# Patient Record
Sex: Male | Born: 1945 | Race: White | Hispanic: No | Marital: Married | Smoking: Current some day smoker
Health system: Southern US, Community
[De-identification: ages and names within clinical notes are randomized; demographics above are authoritative.]

## PROBLEM LIST (undated history)

## (undated) DIAGNOSIS — F1020 Alcohol dependence, uncomplicated: Secondary | ICD-10-CM

## (undated) DIAGNOSIS — I509 Heart failure, unspecified: Secondary | ICD-10-CM

## (undated) DIAGNOSIS — I10 Essential (primary) hypertension: Secondary | ICD-10-CM

## (undated) DIAGNOSIS — J449 Chronic obstructive pulmonary disease, unspecified: Secondary | ICD-10-CM

## (undated) DIAGNOSIS — F17200 Nicotine dependence, unspecified, uncomplicated: Secondary | ICD-10-CM

## (undated) HISTORY — DX: Alcohol dependence, uncomplicated: F10.20

## (undated) HISTORY — DX: Chronic obstructive pulmonary disease, unspecified: J44.9

## (undated) HISTORY — DX: Nicotine dependence, unspecified, uncomplicated: F17.200

## (undated) HISTORY — DX: Essential (primary) hypertension: I10

---

## 2010-05-15 ENCOUNTER — Emergency Department (HOSPITAL_COMMUNITY)
Admission: EM | Admit: 2010-05-15 | Discharge: 2010-05-16 | Payer: Self-pay | Source: Home / Self Care | Admitting: Emergency Medicine

## 2010-08-01 LAB — DIFFERENTIAL
Lymphocytes Relative: 12 % (ref 12–46)
Lymphs Abs: 1.1 10*3/uL (ref 0.7–4.0)
Neutro Abs: 6.5 10*3/uL (ref 1.7–7.7)
Neutrophils Relative %: 75 % (ref 43–77)

## 2010-08-01 LAB — COMPREHENSIVE METABOLIC PANEL
Albumin: 3.7 g/dL (ref 3.5–5.2)
BUN: 17 mg/dL (ref 6–23)
Chloride: 97 mEq/L (ref 96–112)
Glucose, Bld: 98 mg/dL (ref 70–99)
Sodium: 130 mEq/L — ABNORMAL LOW (ref 135–145)
Total Bilirubin: 0.4 mg/dL (ref 0.3–1.2)

## 2010-08-01 LAB — CBC
Hemoglobin: 13.4 g/dL (ref 13.0–17.0)
MCH: 33.8 pg (ref 26.0–34.0)
RBC: 3.97 MIL/uL — ABNORMAL LOW (ref 4.22–5.81)

## 2010-08-01 LAB — POCT CARDIAC MARKERS
CKMB, poc: 1.8 ng/mL (ref 1.0–8.0)
Myoglobin, poc: 60.9 ng/mL (ref 12–200)
Troponin i, poc: <0.05 ng/mL (ref 0.00–0.09)

## 2010-08-01 LAB — ETHANOL: Alcohol, Ethyl (B): 142 mg/dL — ABNORMAL HIGH (ref 0–10)

## 2010-08-01 LAB — URINALYSIS, ROUTINE W REFLEX MICROSCOPIC
Ketones, ur: 15 mg/dL — AB
Nitrite: NEGATIVE
Protein, ur: NEGATIVE mg/dL
Urobilinogen, UA: 0.2 mg/dL (ref 0.0–1.0)

## 2011-05-01 ENCOUNTER — Ambulatory Visit (INDEPENDENT_AMBULATORY_CARE_PROVIDER_SITE_OTHER): Payer: PRIVATE HEALTH INSURANCE | Admitting: Family Medicine

## 2011-05-01 DIAGNOSIS — J449 Chronic obstructive pulmonary disease, unspecified: Secondary | ICD-10-CM

## 2011-06-12 ENCOUNTER — Encounter: Payer: Self-pay | Admitting: Family Medicine

## 2011-06-12 ENCOUNTER — Ambulatory Visit (INDEPENDENT_AMBULATORY_CARE_PROVIDER_SITE_OTHER): Payer: MEDICARE | Admitting: Family Medicine

## 2011-06-12 DIAGNOSIS — J449 Chronic obstructive pulmonary disease, unspecified: Secondary | ICD-10-CM

## 2011-06-12 DIAGNOSIS — F1020 Alcohol dependence, uncomplicated: Secondary | ICD-10-CM | POA: Insufficient documentation

## 2011-06-12 DIAGNOSIS — I10 Essential (primary) hypertension: Secondary | ICD-10-CM

## 2011-06-12 DIAGNOSIS — F17200 Nicotine dependence, unspecified, uncomplicated: Secondary | ICD-10-CM | POA: Insufficient documentation

## 2011-06-12 DIAGNOSIS — J4489 Other specified chronic obstructive pulmonary disease: Secondary | ICD-10-CM

## 2011-06-13 ENCOUNTER — Encounter: Payer: Self-pay | Admitting: Family Medicine

## 2011-06-13 NOTE — Progress Notes (Signed)
This encounter was created in error - please disregard.

## 2011-09-28 ENCOUNTER — Telehealth: Payer: Self-pay

## 2011-09-28 NOTE — Telephone Encounter (Signed)
Pt states he was given one months supply of medications and needs to get refills throughout the year.   [facility] [address]  amLODipine (NORVASC) 5 MG tablet fluticasone-salmeterol (ADVAIR HFA) 230-21 MCG/ACT inhaler benazepril (LOTENSIN) 40 MG tablet

## 2011-09-29 MED ORDER — AMLODIPINE BESYLATE 5 MG PO TABS: 5.0000 mg | ORAL_TABLET | Freq: Every day | ORAL | Status: DC

## 2011-09-29 MED ORDER — BENAZEPRIL HCL 40 MG PO TABS: 40.0000 mg | ORAL_TABLET | Freq: Every day | ORAL | Status: DC

## 2011-09-29 MED ORDER — FLUTICASONE-SALMETEROL 230-21 MCG/ACT IN AERO: 1.0000 | INHALATION_SPRAY | Freq: Two times a day (BID) | RESPIRATORY_TRACT | Status: DC

## 2011-09-29 NOTE — Telephone Encounter (Signed)
Notified pt the req'd RFs have been done for 1 yr. Pt thanked [place]

## 2011-09-29 NOTE — Telephone Encounter (Signed)
Please pull paper chart.  

## 2011-09-29 NOTE — Telephone Encounter (Signed)
done

## 2011-09-29 NOTE — Telephone Encounter (Signed)
Chart is in the phone messages at the nurses station

## 2011-09-30 ENCOUNTER — Other Ambulatory Visit: Payer: Self-pay | Admitting: Family Medicine

## 2011-09-30 NOTE — Telephone Encounter (Signed)
HAS REQUESTED 3 RX, BUT ONLY GOT 2 OF THEM. HE NEEDS REFILL ON INHALER (PROVENTIL)  [facility] [address]

## 2011-10-25 ENCOUNTER — Other Ambulatory Visit: Payer: Self-pay | Admitting: Physician Assistant

## 2012-05-18 ENCOUNTER — Other Ambulatory Visit: Payer: Self-pay | Admitting: Physician Assistant

## 2012-05-27 ENCOUNTER — Ambulatory Visit (INDEPENDENT_AMBULATORY_CARE_PROVIDER_SITE_OTHER): Payer: MEDICARE | Admitting: Family Medicine

## 2012-05-27 ENCOUNTER — Ambulatory Visit: Payer: MEDICARE

## 2012-05-27 VITALS — BP 162/84 | HR 71 | Temp 97.8°F | Resp 16 | Ht 70.0 in | Wt 183.0 lb

## 2012-05-27 DIAGNOSIS — J449 Chronic obstructive pulmonary disease, unspecified: Secondary | ICD-10-CM

## 2012-05-27 DIAGNOSIS — F17200 Nicotine dependence, unspecified, uncomplicated: Secondary | ICD-10-CM

## 2012-05-27 DIAGNOSIS — I10 Essential (primary) hypertension: Secondary | ICD-10-CM

## 2012-05-27 DIAGNOSIS — Z125 Encounter for screening for malignant neoplasm of prostate: Secondary | ICD-10-CM

## 2012-05-27 LAB — LIPID PANEL
Cholesterol: 165 mg/dL (ref 0–200)
HDL: 79 mg/dL (ref >39)
Total CHOL/HDL Ratio: 2.1 Ratio
VLDL: 11 mg/dL (ref 0–40)

## 2012-05-27 LAB — COMPREHENSIVE METABOLIC PANEL
ALT: 13 U/L (ref 0–53)
Calcium: 8.9 mg/dL (ref 8.4–10.5)
Chloride: 102 mEq/L (ref 96–112)
Creat: 1.13 mg/dL (ref 0.50–1.35)
Potassium: 5.2 mEq/L (ref 3.5–5.3)

## 2012-05-27 MED ORDER — HYDROCHLOROTHIAZIDE 25 MG PO TABS: 25.0000 mg | ORAL_TABLET | Freq: Every day | ORAL | Status: DC

## 2012-05-27 MED ORDER — ALBUTEROL SULFATE HFA 108 (90 BASE) MCG/ACT IN AERS: 2.0000 | INHALATION_SPRAY | Freq: Four times a day (QID) | RESPIRATORY_TRACT | Status: DC

## 2012-05-27 MED ORDER — FLUTICASONE-SALMETEROL 230-21 MCG/ACT IN AERO: 1.0000 | INHALATION_SPRAY | Freq: Two times a day (BID) | RESPIRATORY_TRACT | Status: DC

## 2012-05-27 MED ORDER — BENAZEPRIL HCL 40 MG PO TABS: 40.0000 mg | ORAL_TABLET | Freq: Every day | ORAL | Status: DC

## 2012-05-27 MED ORDER — AMLODIPINE BESYLATE 10 MG PO TABS: 10.0000 mg | ORAL_TABLET | Freq: Every day | ORAL | Status: DC

## 2012-05-27 NOTE — Patient Instructions (Addendum)
Urged to strongly consider making up his mind to pick a quit date for smoking secession  Increase the amlodipine, but leave other medicines the same  Try and get regular exercise  Work on cutting back on his alcohol intake.

## 2012-05-27 NOTE — Progress Notes (Signed)
Subjective: 67 year old retired gentleman who has not been here for a little over a year. He's here for a recheck and refill on his blood pressure medications. He has a history of COPD and has been on Advair also. He's been out of the Proventil, but it did not seem to help a lot. The patient continues to smoke a half a pack to a pack a day. He is retired from [employer]. He does not do a lot of regular exercise. He is married. He did get his flu shot.  Objective: Pleasant alert gentleman in no major distress at this time. His TMs are normal. He has a moderate amount of wax in both ear canals. Throat was clear. Neck supple without nodes thyromegaly. Chest has soft wheezing bilaterally. Heart regular without murmurs gallops or arrhythmias. Abdomen soft without masses. Mild hepatomegaly. He does drink about 3 drinks a night.  Assessment: Hypertension, unsatisfactory control Tobacco abuse COPD  Plan: Discussed working on quitting smoking. They need to pick a quit date and do so.  Increase the amlodipine to 10 mg daily  Continue using the Advair. I will refill the Proventil but is not for regular use, just when necessary if he has an acute exacerbation.  Ordered lab work including lipids, complete metabolic panel, and PSA.

## 2013-05-16 ENCOUNTER — Other Ambulatory Visit: Payer: Self-pay | Admitting: Family Medicine

## 2013-06-04 ENCOUNTER — Ambulatory Visit (INDEPENDENT_AMBULATORY_CARE_PROVIDER_SITE_OTHER): Payer: Medicare Other | Admitting: Family Medicine

## 2013-06-04 VITALS — BP 120/80 | HR 104 | Temp 98.0°F | Resp 18 | Ht 69.5 in | Wt 172.0 lb

## 2013-06-04 DIAGNOSIS — F17200 Nicotine dependence, unspecified, uncomplicated: Secondary | ICD-10-CM

## 2013-06-04 DIAGNOSIS — N400 Enlarged prostate without lower urinary tract symptoms: Secondary | ICD-10-CM

## 2013-06-04 DIAGNOSIS — J449 Chronic obstructive pulmonary disease, unspecified: Secondary | ICD-10-CM

## 2013-06-04 DIAGNOSIS — I10 Essential (primary) hypertension: Secondary | ICD-10-CM

## 2013-06-04 DIAGNOSIS — K6289 Other specified diseases of anus and rectum: Secondary | ICD-10-CM

## 2013-06-04 DIAGNOSIS — Z0000 Encounter for general adult medical examination without abnormal findings: Secondary | ICD-10-CM

## 2013-06-04 LAB — COMPREHENSIVE METABOLIC PANEL
ALK PHOS: 47 U/L (ref 39–117)
ALT: 10 U/L (ref 0–53)
AST: 16 U/L (ref 0–37)
Albumin: 4 g/dL (ref 3.5–5.2)
BILIRUBIN TOTAL: 0.9 mg/dL (ref 0.3–1.2)
BUN: 26 mg/dL — ABNORMAL HIGH (ref 6–23)
CALCIUM: 9 mg/dL (ref 8.4–10.5)
CHLORIDE: 94 meq/L — AB (ref 96–112)
CO2: 26 mEq/L (ref 19–32)
Creat: 1.22 mg/dL (ref 0.50–1.35)
Glucose, Bld: 86 mg/dL (ref 70–99)
Potassium: 4.9 mEq/L (ref 3.5–5.3)
SODIUM: 132 meq/L — AB (ref 135–145)
Total Protein: 6.7 g/dL (ref 6.0–8.3)

## 2013-06-04 LAB — LIPID PANEL
CHOLESTEROL: 140 mg/dL (ref 0–200)
HDL: 91 mg/dL (ref >39)
LDL Cholesterol: 41 mg/dL (ref 0–99)
TRIGLYCERIDES: 42 mg/dL (ref <150)
Total CHOL/HDL Ratio: 1.5 Ratio
VLDL: 8 mg/dL (ref 0–40)

## 2013-06-04 LAB — POCT CBC
GRANULOCYTE PERCENT: 77.5 % (ref 37–80)
HCT, POC: 47.8 % (ref 43.5–53.7)
Hemoglobin: 15 g/dL (ref 14.1–18.1)
LYMPH, POC: 1.1 (ref 0.6–3.4)
MCH, POC: 32.3 pg — AB (ref 27–31.2)
MCHC: 31.4 g/dL — AB (ref 31.8–35.4)
MCV: 102.8 fL — AB (ref 80–97)
MID (cbc): 0.7 (ref 0–0.9)
MPV: 7.2 fL (ref 0–99.8)
PLATELET COUNT, POC: 250 10*3/uL (ref 142–424)
POC Granulocyte: 6.2 (ref 2–6.9)
POC LYMPH %: 13.9 % (ref 10–50)
POC MID %: 8.6 %M (ref 0–12)
RBC: 4.65 M/uL — AB (ref 4.69–6.13)
RDW, POC: 13.5 %
WBC: 8 10*3/uL (ref 4.6–10.2)

## 2013-06-04 LAB — IFOBT (OCCULT BLOOD): IMMUNOLOGICAL FECAL OCCULT BLOOD TEST: POSITIVE

## 2013-06-04 LAB — POCT URINALYSIS DIPSTICK
Bilirubin, UA: NEGATIVE
GLUCOSE UA: NEGATIVE
NITRITE UA: NEGATIVE
RBC UA: NEGATIVE
SPEC GRAV UA: 1.02
Urobilinogen, UA: 0.2
pH, UA: 5.5

## 2013-06-04 LAB — PSA, MEDICARE: PSA: 2.34 ng/mL (ref <=4.00)

## 2013-06-04 MED ORDER — BENAZEPRIL HCL 40 MG PO TABS: ORAL_TABLET | ORAL | Status: DC

## 2013-06-04 MED ORDER — HYDROCHLOROTHIAZIDE 25 MG PO TABS: ORAL_TABLET | ORAL | Status: DC

## 2013-06-04 MED ORDER — HYDROCORTISONE ACE-PRAMOXINE 2.5-1 % RE CREA: 1.0000 "application " | TOPICAL_CREAM | Freq: Three times a day (TID) | RECTAL | Status: DC

## 2013-06-04 MED ORDER — ALBUTEROL SULFATE HFA 108 (90 BASE) MCG/ACT IN AERS: 2.0000 | INHALATION_SPRAY | Freq: Four times a day (QID) | RESPIRATORY_TRACT | Status: DC

## 2013-06-04 MED ORDER — FLUTICASONE-SALMETEROL 230-21 MCG/ACT IN AERO: 1.0000 | INHALATION_SPRAY | Freq: Two times a day (BID) | RESPIRATORY_TRACT | Status: DC

## 2013-06-04 MED ORDER — AMLODIPINE BESYLATE 10 MG PO TABS: ORAL_TABLET | ORAL | Status: DC

## 2013-06-04 NOTE — Patient Instructions (Addendum)
Pick a date and make up your mind to quit smoking  Continue same medications  Only use the albuterol inhaler when necessary.  Use the Analpram cream around the anus twice a day for a week or 2 then discontinue it. If blood persists we will need to be sent back to the gastroenterologist.  If doing well return in one year  Check your blood pressure occasionally. If over 140/90 please return.  I will let you know the results of your labs in a few days.

## 2013-06-04 NOTE — Progress Notes (Signed)
Annual physical examination  History: Patient is here for his regular physical examination. It is been one year. He has no major complaints. He's been doing pretty well. He spends most of his time reading. Does not get much regular exercise. He would like a refill on his Proventil and other medications. He continues to smoke about half pack cigarettes a day he knows he needs to quit. He does not get much regular exercise  Past medical history: Occasions: See list Allergies: None Past medical history: Asthma. He had very usually keeps it under control but needs the Proventil occasionally. He also has hypertension for which he takes regular medication. Past surgical history: None  Family history: Both parents are deceased. Mother had Alzheimer's and was in her upper 60s, father died of old age in his 93s  Social history: Smokes one half pack of cigarettes a day, used to smoke more, has been a 35 year smoker. Drinks about 2 or 3 screwdrivers every night. Does not use drugs. He is married and sexually involved with his wife. He is retired he worked in Sales executive. He spends most of his time reading. As not for access. He has a high school education.  Review of systems: Constitutional: Unremarkable HEENT: Unremarkable Eyes: He sees his eye doctor about every 3 years and wears glasses Respiratory: She gets short of breath on exertion. Cardiovascular: Unremarkable Intestinal: He itches a lot around his rectum. He had a colonoscopy a couple of years ago. Endocrine: Unremarkable Neurological: Unremarkable Hematologic: Unremarkable Psychiatric: Unremarkable   Physical examination: Well-developed well-nourished man in no major distress. TMs are normal. Eyes PERRLA. Fundi appear benign. Throat clear. Wears upper dentures. Neck supple without nodes or thyromegaly. No carotid bruits. Chest is clear to auscultation. Heart regular without murmurs gallops or arrhythmias. And soft without organomegaly  or masses. Normal male external genitalia. Testes small. No hernias noted. Extremities unremarkable. Digital rectal exam reveals an enlarged median lobe, nontender. It does not feel particular from, and it is symmetrical. Extremities unremarkable. There is a little bit of red blood on the exam glove. He gets irrtated around his bottom.  Assessment: Annual physical examination COPD Shortness of breath on exertion Mild BPH medially Rectal irritation Tobacco misuse  Plan Labs Results for orders placed in visit on 06/04/13  POCT URINALYSIS DIPSTICK      Result Value Range   Color, UA yellow     Clarity, UA clear     Glucose, UA neg     Bilirubin, UA neg     Ketones, UA trace     Spec Grav, UA 1.020     Blood, UA neg     pH, UA 5.5     Protein, UA trace     Urobilinogen, UA 0.2     Nitrite, UA neg     Leukocytes, UA small (1+)    POCT CBC      Result Value Range   WBC 8.0  4.6 - 10.2 K/uL   Lymph, poc 1.1  0.6 - 3.4   POC LYMPH PERCENT 13.9  10 - 50 %L   MID (cbc) 0.7  0 - 0.9   POC MID % 8.6  0 - 12 %M   POC Granulocyte 6.2  2 - 6.9   Granulocyte percent 77.5  37 - 80 %G   RBC 4.65 (*) 4.69 - 6.13 M/uL   Hemoglobin 15.0  14.1 - 18.1 g/dL   HCT, POC 47.8  43.5 - 53.7 %  MCV 102.8 (*) 80 - 97 fL   MCH, POC 32.3 (*) 27 - 31.2 pg   MCHC 31.4 (*) 31.8 - 35.4 g/dL   RDW, POC 13.5     Platelet Count, POC 250  142 - 424 K/uL   MPV 7.2  0 - 99.8 fL  IFOBT (OCCULT BLOOD)      Result Value Range   IFOBT Positive     Analpram cream. If he keeps noticing a little bit of blood there he might have to get the gastroenterologist to recheck him, however he had a normal colonoscopy 2 or 3 years ago.  Continue the medication.  If the PSA comes back I will refer him to a urologist  Return in one year or as needed

## 2013-06-06 ENCOUNTER — Encounter: Payer: Self-pay | Admitting: Family Medicine

## 2013-06-13 ENCOUNTER — Other Ambulatory Visit: Payer: Self-pay | Admitting: Physician Assistant

## 2013-07-12 ENCOUNTER — Other Ambulatory Visit: Payer: Self-pay | Admitting: Physician Assistant

## 2013-08-04 ENCOUNTER — Ambulatory Visit: Payer: Medicare Other

## 2013-08-04 ENCOUNTER — Ambulatory Visit (INDEPENDENT_AMBULATORY_CARE_PROVIDER_SITE_OTHER): Payer: Medicare Other | Admitting: Emergency Medicine

## 2013-08-04 VITALS — BP 100/60 | HR 94 | Temp 98.2°F | Resp 20 | Ht 70.4 in | Wt 173.0 lb

## 2013-08-04 DIAGNOSIS — J441 Chronic obstructive pulmonary disease with (acute) exacerbation: Secondary | ICD-10-CM

## 2013-08-04 DIAGNOSIS — J189 Pneumonia, unspecified organism: Secondary | ICD-10-CM

## 2013-08-04 MED ORDER — LEVOFLOXACIN 500 MG PO TABS: 500.0000 mg | ORAL_TABLET | Freq: Every day | ORAL | Status: AC

## 2013-08-04 NOTE — Patient Instructions (Signed)
Come back in 2 weeks for repeat xray  STOP SMOKING  Pneumonia, Adult Pneumonia is an infection of the lungs.  CAUSES Pneumonia may be caused by bacteria or a virus. Usually, these infections are caused by breathing infectious particles into the lungs (respiratory tract). SYMPTOMS   Cough.  Fever.  Chest pain.  Increased rate of breathing.  Wheezing.  Mucus production. DIAGNOSIS  If you have the common symptoms of pneumonia, your caregiver will typically confirm the diagnosis with a chest X-ray. The X-ray will show an abnormality in the lung (pulmonary infiltrate) if you have pneumonia. Other tests of your blood, urine, or sputum may be done to find the specific cause of your pneumonia. Your caregiver may also do tests (blood gases or pulse oximetry) to see how well your lungs are working. TREATMENT  Some forms of pneumonia may be spread to other people when you cough or sneeze. You may be asked to wear a mask before and during your exam. Pneumonia that is caused by bacteria is treated with antibiotic medicine. Pneumonia that is caused by the influenza virus may be treated with an antiviral medicine. Most other viral infections must run their course. These infections will not respond to antibiotics.  PREVENTION A pneumococcal shot (vaccine) is available to prevent a common bacterial cause of pneumonia. This is usually suggested for:  People over 82 years old.  Patients on chemotherapy.  People with chronic lung problems, such as bronchitis or emphysema.  People with immune system problems. If you are over 65 or have a high risk condition, you may receive the pneumococcal vaccine if you have not received it before. In some countries, a routine influenza vaccine is also recommended. This vaccine can help prevent some cases of pneumonia.You may be offered the influenza vaccine as part of your care. If you smoke, it is time to quit. You may receive instructions on how to stop smoking.  Your caregiver can provide medicines and counseling to help you quit. HOME CARE INSTRUCTIONS   Cough suppressants may be used if you are losing too much rest. However, coughing protects you by clearing your lungs. You should avoid using cough suppressants if you can.  Your caregiver may have prescribed medicine if he or she thinks your pneumonia is caused by a bacteria or influenza. Finish your medicine even if you start to feel better.  Your caregiver may also prescribe an expectorant. This loosens the mucus to be coughed up.  Only take over-the-counter or prescription medicines for pain, discomfort, or fever as directed by your caregiver.  Do not smoke. Smoking is a common cause of bronchitis and can contribute to pneumonia. If you are a smoker and continue to smoke, your cough may last several weeks after your pneumonia has cleared.  A cold steam vaporizer or humidifier in your room or home may help loosen mucus.  Coughing is often worse at night. Sleeping in a semi-upright position in a recliner or using a couple pillows under your head will help with this.  Get rest as you feel it is needed. Your body will usually let you know when you need to rest. SEEK IMMEDIATE MEDICAL CARE IF:   Your illness becomes worse. This is especially true if you are elderly or weakened from any other disease.  You cannot control your cough with suppressants and are losing sleep.  You begin coughing up blood.  You develop pain which is getting worse or is uncontrolled with medicines.  You have a fever.  Any of the symptoms which initially brought you in for treatment are getting worse rather than better.  You develop shortness of breath or chest pain. MAKE SURE YOU:   Understand these instructions.  Will watch your condition.  Will get help right away if you are not doing well or get worse. Document Released: 05/08/2005 Document Revised: 07/31/2011 Document Reviewed: 07/28/2010 [facility]  Patient Information 2014 [place], [place].

## 2013-08-04 NOTE — Progress Notes (Signed)
[facility] [address], [place] [address] [phone]  Date:  08/04/2013   Name:  Luke Pierce   DOB:  [birth year]   MRN:  [id number]  PCP:  No primary provider on file.    Chief Complaint: Shortness of Breath, CHEST CONGESTION and Cough   History of Present Illness:  Luke Pierce is a 68 y.o. very pleasant male patient who presents with the following:  Increasing shortness of breath and mucous production.  Using MDI sporadically.  Says he has cut down on cigarettes due to shortness of breath.  Cough productive of mucoid sputum.  No fever or chills. No coryza.  No chest pain.  No sore throat or nausea or vomiting.  No stool change or rash.  No improvement with over the counter medications or other home remedies. Denies other complaint or health concern today.   Patient Active Problem List   Diagnosis Date Noted  . COPD (chronic obstructive pulmonary disease)   . Hypertension   . EtOH dependence   . Tobacco dependence     Past Medical History  Diagnosis Date  . COPD (chronic obstructive pulmonary disease)   . Hypertension   . EtOH dependence   . Tobacco dependence     History reviewed. No pertinent past surgical history.  History  Substance Use Topics  . Smoking status: Current Every Day Smoker -- 0.50 packs/day for 35 years  . Smokeless tobacco: Not on file  . Alcohol Use: 6.0 oz/week    10 Cans of beer per week    History reviewed. No pertinent family history.  No Known Allergies  Medication list has been reviewed and updated.  Current Outpatient Prescriptions on File Prior to Visit  Medication Sig Dispense Refill  . albuterol (PROVENTIL HFA) 108 (90 BASE) MCG/ACT inhaler Inhale 2 puffs into the lungs 4 (four) times daily.  1 Inhaler  4  . amLODipine (NORVASC) 10 MG tablet TAKE 1 TABLET EVERY DAY  90 tablet  3  . benazepril (LOTENSIN) 40 MG tablet TAKE 1 TABLET (40 MG TOTAL) BY MOUTH DAILY.  90 tablet  3  . fluticasone-salmeterol  (ADVAIR HFA) 230-21 MCG/ACT inhaler Inhale 1 puff into the lungs 2 (two) times daily.  1 Inhaler  11  . hydrochlorothiazide (HYDRODIURIL) 25 MG tablet TAKE 1 TABLET (25 MG TOTAL) BY MOUTH DAILY.  90 tablet  3   No current facility-administered medications on file prior to visit.    Review of Systems:  As per HPI, otherwise negative.    Physical Examination: Filed Vitals:   08/04/13 0828  BP: 100/60  Pulse: 94  Temp: 98.2 F (36.8 C)  Resp: 20   Filed Vitals:   08/04/13 0828  Height: 5' 10.4" (1.788 m)  Weight: 173 lb (78.472 kg)   Body mass index is 24.55 kg/(m^2). Ideal Body Weight: Weight in (lb) to have BMI = 25: 175.9  GEN: WDWN, NAD, Non-toxic, A & O x 3 HEENT: Atraumatic, Normocephalic. Neck supple. No masses, No LAD. Ears and Nose: No external deformity. CV: RRR, No M/G/R. No JVD. No thrill. No extra heart sounds. PULM: CTA B, no wheezes, crackles, rhonchi. No retractions. No resp. distress. No accessory muscle use. ABD: S, NT, ND, +BS. No rebound. No HSM. EXTR: No c/c/e NEURO Normal gait.  PSYCH: Normally interactive. Conversant. Not depressed or anxious appearing.  Calm demeanor.    Assessment and Plan: LLL pneumonia Exacerbation COPD Follow up in 2 weeks for repeat CXR  Signed,  Ellison Carwin, MD   [facility] reading (PRIMARY) by  Dr. Ouida Sills.  LLL infiltrate.

## 2013-08-17 ENCOUNTER — Ambulatory Visit (INDEPENDENT_AMBULATORY_CARE_PROVIDER_SITE_OTHER): Payer: Medicare Other | Admitting: Family Medicine

## 2013-08-17 ENCOUNTER — Ambulatory Visit: Payer: Medicare Other

## 2013-08-17 VITALS — BP 126/62 | HR 78 | Temp 98.4°F | Resp 16 | Ht 70.0 in | Wt 169.0 lb

## 2013-08-17 DIAGNOSIS — R0902 Hypoxemia: Secondary | ICD-10-CM

## 2013-08-17 DIAGNOSIS — J449 Chronic obstructive pulmonary disease, unspecified: Secondary | ICD-10-CM

## 2013-08-17 DIAGNOSIS — J189 Pneumonia, unspecified organism: Secondary | ICD-10-CM

## 2013-08-17 DIAGNOSIS — F17200 Nicotine dependence, unspecified, uncomplicated: Secondary | ICD-10-CM

## 2013-08-17 DIAGNOSIS — J438 Other emphysema: Secondary | ICD-10-CM

## 2013-08-17 DIAGNOSIS — J181 Lobar pneumonia, unspecified organism: Secondary | ICD-10-CM

## 2013-08-17 LAB — POCT CBC
Granulocyte percent: 79.2 %G (ref 37–80)
HCT, POC: 47.5 % (ref 43.5–53.7)
Hemoglobin: 15 g/dL (ref 14.1–18.1)
Lymph, poc: 0.9 (ref 0.6–3.4)
MCH, POC: 31.4 pg — AB (ref 27–31.2)
MCHC: 31.6 g/dL — AB (ref 31.8–35.4)
MCV: 99.5 fL — AB (ref 80–97)
MID (cbc): 0.7 (ref 0–0.9)
MPV: 6.6 fL (ref 0–99.8)
POC Granulocyte: 6.3 (ref 2–6.9)
POC LYMPH PERCENT: 12 %L (ref 10–50)
POC MID %: 8.8 %M (ref 0–12)
Platelet Count, POC: 390 10*3/uL (ref 142–424)
RBC: 4.77 M/uL (ref 4.69–6.13)
RDW, POC: 13 %
WBC: 7.9 10*3/uL (ref 4.6–10.2)

## 2013-08-17 NOTE — Patient Instructions (Signed)
Your lungs are very bad. Your oxygen level was down to 76 or 77% and should be above 90% at a minimum. A normal lung function would have you up usually around 96-98%.  You have declined an order for oxygen or additional inhaled medications at this time. Continue what you're taking.  Do deep breathing with full inspiration to try to open up your lungs the best you can. Cough frequently to try and cough out any phlegm that may still be down deep in your lungs. Gradually progress her exercise, but do not try to do too much too fast all at once.  He must quit all cigarettes . Have your wife smoke in an area where you have been exposed to none of the smoke. Preferably she would quit also to keep from continue.  Referral will be made to the lung specialist to see what recommendations he can make to you that would enable you to improve some not wearing oxygen possibly.  If you have not yet gotten in with a lung specialist I would like you to return here again in 2 weeks for [place] to recheck your lungs one more time.

## 2013-08-17 NOTE — Progress Notes (Signed)
Subjective: Patient was here 2 weeks ago and diagnosed with a pneumonia on top of his emphysema. He was treated with antibiotics. He's been doing better. Cough is not as bad. He feels better. Moves carefully he does not overexert. He can go to the pressure store.  Objective: Sits comfortably but he is slightly dusky in appearance. When he came in today they got a O2 sat of 77%. I reapplied the monitor and got 81-83. After some coughing and deep breaths it came up to a high of 94-95. He was ambulated around the hall and it came back to 84, then he sat down and it was down to 77. He still smokes a few cigarettes after meals. He is retired  Throat clear. Supple without nodes or thyromegaly. Chest clear to auscultation report her movement. Heart regular without murmurs gallops or arrhythmias.  Assessment: Status post pneumonia Hypoxemia COPD/emphysema  Plan: Chest x-ray and CBC. Trial of 2 L of O2.  97-96%  [facility] reading (PRIMARY) by  Dr. Linna Darner Residual left lower lobe pneumonia  A long talk with the patient about home oxygen, cigarettes, referral to pulmonary, additional medications. He is convinced he can do fine with just living like he has and quitting cigarettes and exercising more. I told him that deep breathing exercises can help, but he can't do too much his oxygen level is so low. He was informed that he is at high risk of heart attacks and strokes and other problems. He chose not to go on anything else today, but I think you will see a pulmonologist if referred. Therefore we will work work on that referral. He can continue his other medications for now. Advised to come back in 2-3 weeks to give [place] to recheck in one more time here.Marland Kitchen

## 2013-09-08 ENCOUNTER — Encounter: Payer: Self-pay | Admitting: Pulmonary Disease

## 2013-09-08 ENCOUNTER — Ambulatory Visit (INDEPENDENT_AMBULATORY_CARE_PROVIDER_SITE_OTHER): Payer: Medicare Other | Admitting: Pulmonary Disease

## 2013-09-08 VITALS — BP 142/80 | HR 73 | Ht 71.0 in | Wt 165.0 lb

## 2013-09-08 DIAGNOSIS — F17200 Nicotine dependence, unspecified, uncomplicated: Secondary | ICD-10-CM

## 2013-09-08 DIAGNOSIS — R0902 Hypoxemia: Secondary | ICD-10-CM

## 2013-09-08 DIAGNOSIS — J449 Chronic obstructive pulmonary disease, unspecified: Secondary | ICD-10-CM

## 2013-09-08 MED ORDER — FLUTICASONE-SALMETEROL 230-21 MCG/ACT IN AERO: 2.0000 | INHALATION_SPRAY | Freq: Two times a day (BID) | RESPIRATORY_TRACT | Status: DC

## 2013-09-08 NOTE — Assessment & Plan Note (Signed)
He was noted to have oxygen desaturation during his recent episode of pneumonia.  He had borderline oxygen desaturation with exertion today, but not enough to qualify for home oxygen set up.  Will arrange for overnight oximetry on room air to further assess.

## 2013-09-08 NOTE — Progress Notes (Signed)
Chief Complaint  Patient presents with  . Pulmonary Consult    referred by Dr. Everlene Farrier for Hypoxia    History of Present Illness: Luke Pierce is a 68 y.o. male smoker for evaluation of hypoxia.  He has history of smoking.  He started at age 48, and smoked up to 1 pack per day.  He cut down to about 2 cigarettes per day.  He has been told he has COPD.  He had breathing tests years ago.  He was treated for pneumonia last month.  He does not feel like he is bouncing back as quick from this respiratory infection.  He had low oxygen level during office visit in March, and that was when he had pneumonia.  He was treated with levaquin.  He does not use supplemental oxygen at home.  He still has occasional cough with clear sputum.  He also gets occasional wheezing.  He denies hemoptysis, or recent fever.  He has noticed more trouble with his breathing with activity.  He denies leg swelling.  He uses advair at night.  He uses proair 4 to 5 times per week.  He was using proventil >> this felt better, but his insurance wouldn't cover proventil.  He is retired.  He used to work in Academic librarian.  He was in the [employer] in [place]  He is from [place], but lived in [place] for 35 years.  He has a International aid/development worker.  He denies history of exposure to tuberculosis.  Luke Pierce  has a past medical history of COPD (chronic obstructive pulmonary disease); Hypertension; EtOH dependence; and Tobacco dependence.  Luke Pierce  has no past surgical history on file.  Prior to Admission medications   Medication Sig Start Date End Date Taking? Authorizing Provider  albuterol (PROVENTIL HFA) 108 (90 BASE) MCG/ACT inhaler Inhale 2 puffs into the lungs 4 (four) times daily. 06/04/13  Yes Posey Boyer, MD  amLODipine (NORVASC) 10 MG tablet TAKE 1 TABLET EVERY DAY 06/04/13  Yes Posey Boyer, MD  benazepril (LOTENSIN) 40 MG tablet TAKE 1 TABLET (40 MG TOTAL) BY MOUTH DAILY. 06/04/13  Yes Posey Boyer, MD   fluticasone-salmeterol (ADVAIR HFA) 230-21 MCG/ACT inhaler Inhale 1 puff into the lungs 2 (two) times daily. 06/04/13  Yes Posey Boyer, MD  hydrochlorothiazide (HYDRODIURIL) 25 MG tablet TAKE 1 TABLET (25 MG TOTAL) BY MOUTH DAILY. 06/04/13  Yes Posey Boyer, MD    No Known Allergies  His family history is not on file.  He  reports that he quit smoking about 2 weeks ago. His smoking use included Cigarettes. He has a 45 pack-year smoking history. He has never used smokeless tobacco. He reports that he drinks about 6 ounces of alcohol per week. He reports that he does not use illicit drugs.  Review of Systems  Constitutional: Negative for fever, chills, diaphoresis, activity change, appetite change, fatigue and unexpected weight change.  HENT: Positive for rhinorrhea. Negative for congestion, dental problem, ear discharge, ear pain, facial swelling, hearing loss, mouth sores, nosebleeds, postnasal drip, sinus pressure, sneezing, sore throat, tinnitus, trouble swallowing and voice change.   Eyes: Negative for photophobia, discharge, itching and visual disturbance.  Respiratory: Positive for cough and shortness of breath. Negative for apnea, choking, chest tightness, wheezing and stridor.   Cardiovascular: Negative for chest pain, palpitations and leg swelling.  Gastrointestinal: Negative for nausea, vomiting, abdominal pain, constipation, blood in stool and abdominal distention.  Genitourinary: Negative for dysuria, urgency, frequency, hematuria, flank  pain, decreased urine volume and difficulty urinating.  Musculoskeletal: Negative for arthralgias, back pain, gait problem, joint swelling, myalgias, neck pain and neck stiffness.  Skin: Negative for color change, pallor and rash.  Neurological: Negative for dizziness, tremors, seizures, syncope, speech difficulty, weakness, light-headedness, numbness and headaches.  Hematological: Negative for adenopathy. Does not bruise/bleed easily.   Psychiatric/Behavioral: Negative for confusion, sleep disturbance and agitation. The patient is not nervous/anxious.    Physical Exam:  General - No distress, thin ENT - No sinus tenderness, no oral exudate, no LAN, no thyromegaly, TM clear, pupils equal/reactive Cardiac - s1s2 regular, no murmur, pulses symmetric Chest - decreased breath sounds, no wheeze/rales Back - No focal tenderness Abd - Soft, non-tender, no organomegaly, + bowel sounds Ext - No edema Neuro - Normal strength, cranial nerves intact Skin - No rashes, multiple areas of ecchymoses Psych - Normal mood, and behavior   Lab Results  Component Value Date   WBC 7.9 08/17/2013   HGB 15.0 08/17/2013   HCT 47.5 08/17/2013   MCV 99.5* 08/17/2013   PLT 195 05/15/2010    Lab Results  Component Value Date   CREATININE 1.22 06/04/2013   BUN 26* 06/04/2013   NA 132* 06/04/2013   K 4.9 06/04/2013   CL 94* 06/04/2013   CO2 26 06/04/2013    Lab Results  Component Value Date   ALT 10 06/04/2013   AST 16 06/04/2013   ALKPHOS 47 06/04/2013   BILITOT 0.9 06/04/2013    Dg Chest 2 View  08/17/2013   CLINICAL DATA:  Hypoxia, COPD.  EXAM: CHEST  2 VIEW  COMPARISON:  08/04/2013  FINDINGS: Improving opacity within the left lower lobe. Mild residual opacity/pneumonia persists. Small adjacent left pleural effusion.  There is hyperinflation of the lungs compatible with COPD. No confluent opacity on the right. Heart is normal size. No acute bony abnormality.  IMPRESSION: Left lower lobe pneumonia improving since prior study. Small left effusion.  COPD.   Electronically Signed   By: Rolm Baptise M.D.   On: 08/17/2013 10:15   Assessment/Plan:  Luke Mires, MD [facility] Pager:  [phone]

## 2013-09-08 NOTE — Progress Notes (Deleted)
   Subjective:    Patient ID: Luke Pierce, male    DOB: 07/26/1945, 68 y.o.   MRN: [id number]  HPI    Review of Systems  Constitutional: Negative for fever, chills, diaphoresis, activity change, appetite change, fatigue and unexpected weight change.  HENT: Positive for rhinorrhea. Negative for congestion, dental problem, ear discharge, ear pain, facial swelling, hearing loss, mouth sores, nosebleeds, postnasal drip, sinus pressure, sneezing, sore throat, tinnitus, trouble swallowing and voice change.   Eyes: Negative for photophobia, discharge, itching and visual disturbance.  Respiratory: Positive for cough and shortness of breath. Negative for apnea, choking, chest tightness, wheezing and stridor.   Cardiovascular: Negative for chest pain, palpitations and leg swelling.  Gastrointestinal: Negative for nausea, vomiting, abdominal pain, constipation, blood in stool and abdominal distention.  Genitourinary: Negative for dysuria, urgency, frequency, hematuria, flank pain, decreased urine volume and difficulty urinating.  Musculoskeletal: Negative for arthralgias, back pain, gait problem, joint swelling, myalgias, neck pain and neck stiffness.  Skin: Negative for color change, pallor and rash.  Neurological: Negative for dizziness, tremors, seizures, syncope, speech difficulty, weakness, light-headedness, numbness and headaches.  Hematological: Negative for adenopathy. Does not bruise/bleed easily.  Psychiatric/Behavioral: Negative for confusion, sleep disturbance and agitation. The patient is not nervous/anxious.        Objective:   Physical Exam        Assessment & Plan:

## 2013-09-08 NOTE — Patient Instructions (Addendum)
Call if you want to set up screening CT scan for lung cancer Use advair two puffs twice per day Will arrange for breathing test (PFT) Will arrange for overnight oxygen test Follow up in 6 weeks

## 2013-09-08 NOTE — Assessment & Plan Note (Signed)
Advised him to try using advair two puffs bid.  He is to continue prn proair.  Will arrange for pulmonary function testing to further assess.  Will discuss at follow up about whether he needs to have alpha 1 anti-trypsin testing, and whether he could benefit from pulmonary rehab.

## 2013-09-08 NOTE — Assessment & Plan Note (Signed)
Discussed how smoking is contributing to his respiratory problems.  Encouraged him to continue with his smoking cessation efforts.  Also reviewed option of low dose CT chest protocol for lung cancer screening >> he would like to think about this.

## 2013-09-17 ENCOUNTER — Telehealth: Payer: Self-pay | Admitting: Pulmonary Disease

## 2013-09-17 NOTE — Telephone Encounter (Signed)
lmtcb x1 Per VS ---  If pt doesn't qualify for nocturnal oxygen, then he doesn't qualify. When he was here on 09/08/13, we already "walked" him and he didn't qualify.

## 2013-09-18 NOTE — Telephone Encounter (Signed)
LMTCB for Luke Pierce  

## 2013-09-19 NOTE — Telephone Encounter (Signed)
lmomtcb for [employer]

## 2013-09-22 NOTE — Telephone Encounter (Signed)
I have sent a staff message to Corene Cornea to advise him of VS response. Will follow-up on staff message if anything is needed. [place], CMA

## 2013-10-15 ENCOUNTER — Other Ambulatory Visit: Payer: Self-pay | Admitting: Family Medicine

## 2013-11-30 ENCOUNTER — Other Ambulatory Visit: Payer: Self-pay | Admitting: Family Medicine

## 2013-12-14 ENCOUNTER — Other Ambulatory Visit: Payer: Self-pay | Admitting: Physician Assistant

## 2014-02-23 ENCOUNTER — Ambulatory Visit (INDEPENDENT_AMBULATORY_CARE_PROVIDER_SITE_OTHER): Payer: Medicare Other | Admitting: Emergency Medicine

## 2014-02-23 ENCOUNTER — Telehealth: Payer: Self-pay

## 2014-02-23 VITALS — BP 128/70 | HR 75 | Temp 98.3°F | Resp 18 | Ht 70.75 in | Wt 161.0 lb

## 2014-02-23 DIAGNOSIS — L03114 Cellulitis of left upper limb: Secondary | ICD-10-CM

## 2014-02-23 DIAGNOSIS — M79602 Pain in left arm: Secondary | ICD-10-CM

## 2014-02-23 LAB — POCT CBC
Granulocyte percent: 81.9 %G — AB (ref 37–80)
HCT, POC: 49.7 % (ref 43.5–53.7)
Hemoglobin: 16.1 g/dL (ref 14.1–18.1)
LYMPH, POC: 1 (ref 0.6–3.4)
MCH: 33.3 pg — AB (ref 27–31.2)
MCHC: 32.4 g/dL (ref 31.8–35.4)
MCV: 102.7 fL — AB (ref 80–97)
MID (cbc): 0.4 (ref 0–0.9)
MPV: 5.6 fL (ref 0–99.8)
PLATELET COUNT, POC: 271 10*3/uL (ref 142–424)
POC Granulocyte: 6.5 (ref 2–6.9)
POC LYMPH PERCENT: 13 %L (ref 10–50)
POC MID %: 5.1 % (ref 0–12)
RBC: 4.84 M/uL (ref 4.69–6.13)
RDW, POC: 14.8 %
WBC: 7.9 10*3/uL (ref 4.6–10.2)

## 2014-02-23 LAB — GLUCOSE, POCT (MANUAL RESULT ENTRY): POC Glucose: 89 mg/dl (ref 70–99)

## 2014-02-23 MED ORDER — CEFTRIAXONE SODIUM 1 G IJ SOLR
1.0000 g | Freq: Once | INTRAMUSCULAR | Status: AC
  Administered 2014-02-23: 1 g via INTRAMUSCULAR

## 2014-02-23 MED ORDER — CLINDAMYCIN HCL 300 MG PO CAPS: 300.0000 mg | ORAL_CAPSULE | Freq: Three times a day (TID) | ORAL | Status: DC

## 2014-02-23 NOTE — Patient Instructions (Signed)

## 2014-02-23 NOTE — Telephone Encounter (Signed)
Per Dr. Everlene Farrier called pt to make sure he starts the oral antibiotic today.

## 2014-02-23 NOTE — Progress Notes (Signed)
Subjective:    Patient ID: Luke Pierce, male    DOB: [birth year], 68 y.o.   MRN: [id number] This chart was scribed for Arlyss Queen, MD by Marti Sleigh, Medical Scribe. This patient was seen in Room 14 and the patient's care was started at 9:55 AM.   HPI HPI Comments: Luke Pierce is a 68 y.o. male with a hx of DM who presents to the Emergency Department complaining pain and redness of the mid-left upper arm and extends to his fingers that started five days ago. Pt denies fever, aching, vomiting, chills, flu-like symptoms. Pt states he had similar symptoms on his right arm several weeks before that resolved with cortisone cream. Pt endorses increased heat in the arm. Pt has been a smoker for 45 years, 1 PPD (historically). Currently smokes 1/3 PPD.  Pt's PCP is Dr. Linna Darner.   Review of Systems  Constitutional: Negative for appetite change and fatigue.  HENT: Negative for congestion, ear discharge and sinus pressure.   Eyes: Negative for discharge.  Respiratory:       Hx of COPD.  Cardiovascular: Negative for chest pain.  Gastrointestinal: Negative for abdominal pain and diarrhea.  Genitourinary: Negative for frequency and hematuria.  Musculoskeletal: Negative for back pain.  Skin: Negative for rash.  Neurological: Negative for seizures and headaches.  Psychiatric/Behavioral: Negative for hallucinations.       Objective:   Physical Exam  Constitutional: He is oriented to person, place, and time. He appears well-developed.  HENT:  Head: Normocephalic.  Eyes: Conjunctivae and EOM are normal. No scleral icterus.  Neck: Neck supple. No thyromegaly present.  Cardiovascular: Normal rate and regular rhythm.  Exam reveals no gallop and no friction rub.   No murmur heard. Pulmonary/Chest: No stridor.  COPD.  Abdominal: He exhibits no distension. There is no tenderness. There is no rebound.  Musculoskeletal: Normal range of motion. He exhibits no edema.  Lymphadenopathy:    He has  no cervical adenopathy.  Neurological: He is oriented to person, place, and time. He exhibits normal muscle tone. Coordination normal.  Skin: There is erythema.  Cellulitis.  Psychiatric: He has a normal mood and affect. His behavior is normal.   Results for orders placed in visit on 02/23/14  POCT CBC      Result Value Ref Range   WBC 7.9  4.6 - 10.2 K/uL   Lymph, poc 1.0  0.6 - 3.4   POC LYMPH PERCENT 13.0  10 - 50 %L   MID (cbc) 0.4  0 - 0.9   POC MID % 5.1  0 - 12 %M   POC Granulocyte 6.5  2 - 6.9   Granulocyte percent 81.9 (*) 37 - 80 %G   RBC 4.84  4.69 - 6.13 M/uL   Hemoglobin 16.1  14.1 - 18.1 g/dL   HCT, POC 49.7  43.5 - 53.7 %   MCV 102.7 (*) 80 - 97 fL   MCH, POC 33.3 (*) 27 - 31.2 pg   MCHC 32.4  31.8 - 35.4 g/dL   RDW, POC 14.8     Platelet Count, POC 271  142 - 424 K/uL   MPV 5.6  0 - 99.8 fL  GLUCOSE, POCT (MANUAL RESULT ENTRY)      Result Value Ref Range   POC Glucose 89  70 - 99 mg/dl    Meds ordered this encounter  Medications  . clindamycin (CLEOCIN) 300 MG capsule    Sig: Take 1 capsule (300 mg total) by mouth  3 (three) times daily.    Dispense:  30 capsule    Refill:  0  . cefTRIAXone (ROCEPHIN) injection 1 g    Sig:     Order Specific Question:  Antibiotic Indication:    Answer:  Cellulitis        Assessment & Plan:  Patient has a suspected cellulitis involving the left arm. I suspect this started in the left olecranon area. I will try and get a culture of the inflamed area over the olecranon bursa he will be given 1 g of Rocephin IM and placed on Cleocin 300  3  times a day to followup at 8 AM in the morning and then I can followup with him on Wednesday . I used a 20-gauge needle to try and obtain a small amount of fluid from 2 crusted areas over the olecranon. This was sent for culture.  I personally performed the services described in this documentation, which was scribed in my presence. The recorded information has been reviewed and is  accurate.

## 2014-02-24 ENCOUNTER — Ambulatory Visit (INDEPENDENT_AMBULATORY_CARE_PROVIDER_SITE_OTHER): Payer: Medicare Other | Admitting: Internal Medicine

## 2014-02-24 VITALS — BP 140/72 | HR 72 | Temp 98.2°F | Resp 20 | Ht 70.0 in | Wt 160.0 lb

## 2014-02-24 DIAGNOSIS — M79622 Pain in left upper arm: Secondary | ICD-10-CM

## 2014-02-24 DIAGNOSIS — L03114 Cellulitis of left upper limb: Secondary | ICD-10-CM

## 2014-02-24 DIAGNOSIS — M25522 Pain in left elbow: Secondary | ICD-10-CM

## 2014-02-24 MED ORDER — CEFTRIAXONE SODIUM 1 G IJ SOLR
1.0000 g | Freq: Once | INTRAMUSCULAR | Status: AC
Start: 2014-02-24 — End: 2014-02-24
  Administered 2014-02-24: 1 g via INTRAMUSCULAR

## 2014-02-24 NOTE — Progress Notes (Signed)
   Subjective:    Patient ID: Luke Pierce, male    DOB: [birth year], 68 y.o.   MRN: [id number]  HPI 68 yr old Caucasian male is here as a follow up for Cellulitis of the left arm. He states that his arm is not as tender or warm as it was on yesterday, Monday, October 5 th. He states has take three of his antibiotics thus far. No fever, slept well.   Review of Systems     Objective:   Physical Exam  Constitutional: He is oriented to person, place, and time. He appears well-nourished. No distress.  HENT:  Head: Normocephalic.  Mouth/Throat: Oropharynx is clear and moist.  Eyes: EOM are normal. No scleral icterus.  Neck: Normal range of motion. Neck supple.  Cardiovascular: Normal rate.   Pulmonary/Chest: Effort normal. No respiratory distress. He has decreased breath sounds. He has no wheezes. He has no rhonchi. He has no rales.  Musculoskeletal: Normal range of motion.  Neurological: He is alert and oriented to person, place, and time. He exhibits normal muscle tone. Coordination normal.  Skin: Rash noted. Rash is macular. Rash is not pustular and not vesicular. There is erythema.     Less red, less tender, improving cellulitis  Psychiatric: He has a normal mood and affect.          Assessment & Plan:

## 2014-02-25 ENCOUNTER — Ambulatory Visit (INDEPENDENT_AMBULATORY_CARE_PROVIDER_SITE_OTHER): Payer: Medicare Other | Admitting: Family Medicine

## 2014-02-25 ENCOUNTER — Other Ambulatory Visit: Payer: Self-pay | Admitting: Family Medicine

## 2014-02-25 VITALS — BP 126/70 | HR 76 | Temp 98.1°F | Resp 18 | Ht 70.0 in | Wt 161.0 lb

## 2014-02-25 DIAGNOSIS — L03114 Cellulitis of left upper limb: Secondary | ICD-10-CM

## 2014-02-25 LAB — WOUND CULTURE
GRAM STAIN: NONE SEEN
GRAM STAIN: NONE SEEN
Gram Stain: NONE SEEN

## 2014-02-25 MED ORDER — CEFTRIAXONE SODIUM 1 G IJ SOLR
1.0000 g | Freq: Once | INTRAMUSCULAR | Status: AC
Start: 2014-02-25 — End: 2014-02-25
  Administered 2014-02-25: 1 g via INTRAMUSCULAR

## 2014-02-25 NOTE — Patient Instructions (Signed)
Continue to use the oral antibiotic as directed.  If you do not continue to steadily improve please come back and see [place] again!  If you have any fever or flu like symptoms come back right away!  Please get your flu shot in a couple of weeks.

## 2014-02-25 NOTE — Progress Notes (Signed)
[facility] [address] [place] [address] [phone]  Date:  02/25/2014   Name:  Luke Pierce   DOB:  01-01-1946   MRN:  [id number]  PCP:  Ruben Reason, MD    Chief Complaint: Wound Check   History of Present Illness:  Luke Pierce is a 68 y.o. very pleasant male patient who presents with the following:  Here today to recheck cellulitis of the left arm.  He has been seen over the last couple of days for spontaneously occuring cellulitis that may have been related to an abrasion of the olecranon.  He was treated with rocephin on 10/5 and 10/6, and has been taking cloecin,.  A culture was sent but just seems to have grown mixed bacteria.   He is feeling well overall, and notes that his arm is better. He has no fever and energy level is good.  He feels that the redness, heat and swelling in his arm are improved.  He is taking his PO abx ok   Patient Active Problem List   Diagnosis Date Noted  . Hypoxia 09/08/2013  . COPD (chronic obstructive pulmonary disease)   . Hypertension   . EtOH dependence   . Tobacco dependence     Past Medical History  Diagnosis Date  . COPD (chronic obstructive pulmonary disease)   . Hypertension   . EtOH dependence   . Tobacco dependence     History reviewed. No pertinent past surgical history.  History  Substance Use Topics  . Smoking status: Former Smoker -- 1.00 packs/day for 45 years    Types: Cigarettes    Quit date: 08/25/2013  . Smokeless tobacco: Never Used  . Alcohol Use: 6.0 oz/week    10 Cans of beer per week    History reviewed. No pertinent family history.  No Known Allergies  Medication list has been reviewed and updated.  Current Outpatient Prescriptions on File Prior to Visit  Medication Sig Dispense Refill  . albuterol (PROAIR HFA) 108 (90 BASE) MCG/ACT inhaler Inhale 2 puffs into the lungs every 6 (six) hours as needed for wheezing or shortness of breath.      Marland Kitchen amLODipine (NORVASC) 10  MG tablet TAKE 1 TABLET EVERY DAY  90 tablet  3  . benazepril (LOTENSIN) 40 MG tablet TAKE 1 TABLET (40 MG TOTAL) BY MOUTH DAILY.  90 tablet  2  . clindamycin (CLEOCIN) 300 MG capsule Take 1 capsule (300 mg total) by mouth 3 (three) times daily.  30 capsule  0  . fluticasone-salmeterol (ADVAIR HFA) 230-21 MCG/ACT inhaler Inhale 2 puffs into the lungs 2 (two) times daily.  1 Inhaler  11  . hydrochlorothiazide (HYDRODIURIL) 25 MG tablet TAKE 1 TABLET BY MOUTH DAILY.  30 tablet  0   No current facility-administered medications on file prior to visit.    Review of Systems:  [place]   Physical Examination: Filed Vitals:   02/25/14 0830  BP: 126/70  Pulse: 76  Temp: 98.1 F (36.7 C)  Resp: 18   Filed Vitals:   02/25/14 0830  Height: 5\' 10"  (1.778 m)  Weight: 161 lb (73.029 kg)   Body mass index is 23.1 kg/(m^2). Ideal Body Weight: Weight in (lb) to have BMI = 25: 173.9  GEN: WDWN, NAD, Non-toxic, A & O x 3, looks well HEENT: Atraumatic, Normocephalic. Neck supple. No masses, No LAD. Ears and Nose: No external deformity. CV: RRR, No M/G/R. No JVD. No thrill. No extra heart sounds. PULM:  CTA B, no wheezes, crackles, rhonchi. No retractions. No resp. distress. No accessory muscle use. EXTR: No c/c/e NEURO Normal gait.  PSYCH: Normally interactive. Conversant. Not depressed or anxious appearing.  Calm demeanor.  Left arm:  There is diffuse mild warmth, minimal tenderness and mild to moderate swelling over the forearm and elbow. He states that his arm is much improved, pain and swelling is less and heat is much better.  There is a skin marker border- the redness has receded from this border.  The olecranon is still slightly swollen. Normal ROM of wrist and elbow  Assessment and Plan: Cellulitis of left arm - Plan: cefTRIAXone (ROCEPHIN) injection 1 g  Left arm cellulitis involving the elbow.  He is doing better, treat with IM rocephin again today and continue po abx.  He will return  if not continuing to steadily improved- Sooner if worse.     Signed Lamar Blinks, MD

## 2014-03-22 ENCOUNTER — Ambulatory Visit (INDEPENDENT_AMBULATORY_CARE_PROVIDER_SITE_OTHER): Payer: Medicare Other | Admitting: *Deleted

## 2014-03-22 ENCOUNTER — Ambulatory Visit: Payer: Medicare Other | Admitting: *Deleted

## 2014-03-22 DIAGNOSIS — Z23 Encounter for immunization: Secondary | ICD-10-CM

## 2014-04-05 ENCOUNTER — Other Ambulatory Visit: Payer: Self-pay | Admitting: Physician Assistant

## 2014-04-06 NOTE — Telephone Encounter (Signed)
Spoke with patient making an appointment for a physical with Dr. Everlene Farrier.

## 2014-04-25 ENCOUNTER — Encounter (HOSPITAL_COMMUNITY): Payer: Self-pay | Admitting: Nurse Practitioner

## 2014-04-25 ENCOUNTER — Emergency Department (HOSPITAL_COMMUNITY): Payer: Medicare Other

## 2014-04-25 ENCOUNTER — Inpatient Hospital Stay (HOSPITAL_COMMUNITY): Payer: Medicare Other

## 2014-04-25 ENCOUNTER — Other Ambulatory Visit: Payer: Self-pay

## 2014-04-25 ENCOUNTER — Inpatient Hospital Stay (HOSPITAL_COMMUNITY)
Admission: EM | Admit: 2014-04-25 | Discharge: 2014-04-28 | DRG: 551 | Disposition: A | Discharge status: Home / Self Care | Payer: Medicare Other | Attending: Internal Medicine | Admitting: Internal Medicine

## 2014-04-25 DIAGNOSIS — L03115 Cellulitis of right lower limb: Secondary | ICD-10-CM | POA: Diagnosis present

## 2014-04-25 DIAGNOSIS — R29898 Other symptoms and signs involving the musculoskeletal system: Secondary | ICD-10-CM | POA: Insufficient documentation

## 2014-04-25 DIAGNOSIS — R0602 Shortness of breath: Secondary | ICD-10-CM | POA: Diagnosis present

## 2014-04-25 DIAGNOSIS — E871 Hypo-osmolality and hyponatremia: Secondary | ICD-10-CM | POA: Diagnosis present

## 2014-04-25 DIAGNOSIS — M5030 Other cervical disc degeneration, unspecified cervical region: Secondary | ICD-10-CM | POA: Diagnosis present

## 2014-04-25 DIAGNOSIS — D72819 Decreased white blood cell count, unspecified: Secondary | ICD-10-CM | POA: Diagnosis present

## 2014-04-25 DIAGNOSIS — J189 Pneumonia, unspecified organism: Secondary | ICD-10-CM | POA: Diagnosis present

## 2014-04-25 DIAGNOSIS — I10 Essential (primary) hypertension: Secondary | ICD-10-CM | POA: Diagnosis present

## 2014-04-25 DIAGNOSIS — I509 Heart failure, unspecified: Secondary | ICD-10-CM

## 2014-04-25 DIAGNOSIS — M47894 Other spondylosis, thoracic region: Secondary | ICD-10-CM | POA: Diagnosis present

## 2014-04-25 DIAGNOSIS — J440 Chronic obstructive pulmonary disease with acute lower respiratory infection: Secondary | ICD-10-CM | POA: Diagnosis present

## 2014-04-25 DIAGNOSIS — I639 Cerebral infarction, unspecified: Secondary | ICD-10-CM

## 2014-04-25 DIAGNOSIS — M4802 Spinal stenosis, cervical region: Secondary | ICD-10-CM | POA: Diagnosis present

## 2014-04-25 DIAGNOSIS — M5021 Other cervical disc displacement,  high cervical region: Secondary | ICD-10-CM | POA: Diagnosis present

## 2014-04-25 DIAGNOSIS — I5031 Acute diastolic (congestive) heart failure: Secondary | ICD-10-CM | POA: Diagnosis present

## 2014-04-25 DIAGNOSIS — F1020 Alcohol dependence, uncomplicated: Secondary | ICD-10-CM | POA: Diagnosis present

## 2014-04-25 DIAGNOSIS — E875 Hyperkalemia: Secondary | ICD-10-CM | POA: Diagnosis present

## 2014-04-25 DIAGNOSIS — M5134 Other intervertebral disc degeneration, thoracic region: Secondary | ICD-10-CM | POA: Diagnosis present

## 2014-04-25 DIAGNOSIS — I5030 Unspecified diastolic (congestive) heart failure: Secondary | ICD-10-CM | POA: Diagnosis present

## 2014-04-25 DIAGNOSIS — F17200 Nicotine dependence, unspecified, uncomplicated: Secondary | ICD-10-CM | POA: Diagnosis present

## 2014-04-25 DIAGNOSIS — Z87891 Personal history of nicotine dependence: Secondary | ICD-10-CM | POA: Diagnosis not present

## 2014-04-25 DIAGNOSIS — M47892 Other spondylosis, cervical region: Secondary | ICD-10-CM | POA: Diagnosis present

## 2014-04-25 DIAGNOSIS — R0902 Hypoxemia: Secondary | ICD-10-CM

## 2014-04-25 DIAGNOSIS — N179 Acute kidney failure, unspecified: Secondary | ICD-10-CM | POA: Diagnosis present

## 2014-04-25 DIAGNOSIS — D45 Polycythemia vera: Secondary | ICD-10-CM | POA: Diagnosis present

## 2014-04-25 DIAGNOSIS — J449 Chronic obstructive pulmonary disease, unspecified: Secondary | ICD-10-CM | POA: Diagnosis present

## 2014-04-25 DIAGNOSIS — J9601 Acute respiratory failure with hypoxia: Secondary | ICD-10-CM | POA: Diagnosis present

## 2014-04-25 HISTORY — DX: Heart failure, unspecified: I50.9

## 2014-04-25 LAB — URINALYSIS, ROUTINE W REFLEX MICROSCOPIC
BILIRUBIN URINE: NEGATIVE
Glucose, UA: NEGATIVE mg/dL
Hgb urine dipstick: NEGATIVE
Ketones, ur: 15 mg/dL — AB
Leukocytes, UA: NEGATIVE
Nitrite: NEGATIVE
PH: 5.5 (ref 5.0–8.0)
Protein, ur: NEGATIVE mg/dL
SPECIFIC GRAVITY, URINE: 1.016 (ref 1.005–1.030)
Urobilinogen, UA: 1 mg/dL (ref 0.0–1.0)

## 2014-04-25 LAB — OSMOLALITY: OSMOLALITY: 278 mosm/kg (ref 275–300)

## 2014-04-25 LAB — STREP PNEUMONIAE URINARY ANTIGEN: Strep Pneumo Urinary Antigen: NEGATIVE

## 2014-04-25 LAB — CBC WITH DIFFERENTIAL/PLATELET
BASOS ABS: 0 10*3/uL (ref 0.0–0.1)
Basophils Relative: 0 % (ref 0–1)
EOS ABS: 0 10*3/uL (ref 0.0–0.7)
EOS PCT: 0 % (ref 0–5)
HEMATOCRIT: 52.2 % — AB (ref 39.0–52.0)
Hemoglobin: 18.1 g/dL — ABNORMAL HIGH (ref 13.0–17.0)
Lymphocytes Relative: 8 % — ABNORMAL LOW (ref 12–46)
Lymphs Abs: 0.5 10*3/uL — ABNORMAL LOW (ref 0.7–4.0)
MCH: 31.2 pg (ref 26.0–34.0)
MCHC: 34.7 g/dL (ref 30.0–36.0)
MCV: 90 fL (ref 78.0–100.0)
MONO ABS: 0.7 10*3/uL (ref 0.1–1.0)
Monocytes Relative: 11 % (ref 3–12)
Neutro Abs: 5.2 10*3/uL (ref 1.7–7.7)
Neutrophils Relative %: 81 % — ABNORMAL HIGH (ref 43–77)
PLATELETS: 150 10*3/uL (ref 150–400)
RBC: 5.8 MIL/uL (ref 4.22–5.81)
RDW: 13.7 % (ref 11.5–15.5)
WBC: 6.4 10*3/uL (ref 4.0–10.5)

## 2014-04-25 LAB — TROPONIN I
Troponin I: <0.3 ng/mL (ref <0.30)
Troponin I: <0.3 ng/mL (ref <0.30)

## 2014-04-25 LAB — COMPREHENSIVE METABOLIC PANEL
ALT: 37 U/L (ref 0–53)
AST: 32 U/L (ref 0–37)
Albumin: 3.5 g/dL (ref 3.5–5.2)
Alkaline Phosphatase: 53 U/L (ref 39–117)
Anion gap: 13 (ref 5–15)
BUN: 67 mg/dL — ABNORMAL HIGH (ref 6–23)
CALCIUM: 8.4 mg/dL (ref 8.4–10.5)
CO2: 32 mEq/L (ref 19–32)
Chloride: 78 mEq/L — ABNORMAL LOW (ref 96–112)
Creatinine, Ser: 1.38 mg/dL — ABNORMAL HIGH (ref 0.50–1.35)
GFR, EST AFRICAN AMERICAN: 59 mL/min — AB (ref >90)
GFR, EST NON AFRICAN AMERICAN: 51 mL/min — AB (ref >90)
GLUCOSE: 86 mg/dL (ref 70–99)
Potassium: 5.2 mEq/L (ref 3.7–5.3)
SODIUM: 123 meq/L — AB (ref 137–147)
TOTAL PROTEIN: 6.2 g/dL (ref 6.0–8.3)
Total Bilirubin: 0.9 mg/dL (ref 0.3–1.2)

## 2014-04-25 LAB — PRO B NATRIURETIC PEPTIDE: PRO B NATRI PEPTIDE: 5975 pg/mL — AB (ref 0–125)

## 2014-04-25 LAB — I-STAT TROPONIN, ED: Troponin i, poc: 0.17 ng/mL (ref 0.00–0.08)

## 2014-04-25 LAB — PROTIME-INR
INR: 1.2 (ref 0.00–1.49)
Prothrombin Time: 15.3 seconds — ABNORMAL HIGH (ref 11.6–15.2)

## 2014-04-25 LAB — CREATININE, URINE, RANDOM: CREATININE, URINE: 68.76 mg/dL

## 2014-04-25 LAB — SODIUM, URINE, RANDOM: Sodium, Ur: <20 mEq/L

## 2014-04-25 LAB — OSMOLALITY, URINE: OSMOLALITY UR: 466 mosm/kg (ref 390–1090)

## 2014-04-25 MED ORDER — DEXTROSE 5 % IV SOLN
1.0000 g | Freq: Three times a day (TID) | INTRAVENOUS | Status: DC
  Administered 2014-04-25 – 2014-04-28 (×8): 1 g via INTRAVENOUS
  Filled 2014-04-25 (×11): qty 1

## 2014-04-25 MED ORDER — NICOTINE 14 MG/24HR TD PT24
14.0000 mg | MEDICATED_PATCH | Freq: Every day | TRANSDERMAL | Status: DC
  Administered 2014-04-25: 14 mg via TRANSDERMAL
  Filled 2014-04-25: qty 1

## 2014-04-25 MED ORDER — NICOTINE 14 MG/24HR TD PT24
14.0000 mg | MEDICATED_PATCH | Freq: Every day | TRANSDERMAL | Status: DC
  Administered 2014-04-26 – 2014-04-28 (×3): 14 mg via TRANSDERMAL
  Filled 2014-04-25 (×3): qty 1

## 2014-04-25 MED ORDER — POTASSIUM CHLORIDE CRYS ER 20 MEQ PO TBCR
20.0000 meq | EXTENDED_RELEASE_TABLET | Freq: Two times a day (BID) | ORAL | Status: DC
  Administered 2014-04-25 – 2014-04-28 (×6): 20 meq via ORAL
  Filled 2014-04-25 (×6): qty 1

## 2014-04-25 MED ORDER — SODIUM CHLORIDE 0.9 % IJ SOLN: 3.0000 mL | INTRAMUSCULAR | Status: DC | PRN

## 2014-04-25 MED ORDER — SODIUM CHLORIDE 0.9 % IV SOLN: 250.0000 mL | INTRAVENOUS | Status: DC | PRN

## 2014-04-25 MED ORDER — FUROSEMIDE 10 MG/ML IJ SOLN
40.0000 mg | Freq: Two times a day (BID) | INTRAMUSCULAR | Status: DC
  Administered 2014-04-25 – 2014-04-27 (×4): 40 mg via INTRAVENOUS
  Filled 2014-04-25 (×4): qty 4

## 2014-04-25 MED ORDER — ALBUTEROL SULFATE (2.5 MG/3ML) 0.083% IN NEBU
5.0000 mg | INHALATION_SOLUTION | Freq: Once | RESPIRATORY_TRACT | Status: AC
Start: 2014-04-25 — End: 2014-04-25
  Administered 2014-04-25: 5 mg via RESPIRATORY_TRACT
  Filled 2014-04-25: qty 6

## 2014-04-25 MED ORDER — IPRATROPIUM-ALBUTEROL 0.5-2.5 (3) MG/3ML IN SOLN
3.0000 mL | Freq: Three times a day (TID) | RESPIRATORY_TRACT | Status: DC
  Administered 2014-04-26 – 2014-04-28 (×7): 3 mL via RESPIRATORY_TRACT
  Filled 2014-04-25 (×7): qty 3

## 2014-04-25 MED ORDER — ALBUTEROL SULFATE (2.5 MG/3ML) 0.083% IN NEBU: 2.5000 mg | INHALATION_SOLUTION | RESPIRATORY_TRACT | Status: DC | PRN

## 2014-04-25 MED ORDER — IPRATROPIUM-ALBUTEROL 0.5-2.5 (3) MG/3ML IN SOLN
3.0000 mL | Freq: Once | RESPIRATORY_TRACT | Status: AC
  Administered 2014-04-25: 3 mL via RESPIRATORY_TRACT

## 2014-04-25 MED ORDER — BISOPROLOL FUMARATE 10 MG PO TABS
20.0000 mg | ORAL_TABLET | Freq: Every day | ORAL | Status: DC
  Administered 2014-04-25 – 2014-04-28 (×4): 20 mg via ORAL
  Filled 2014-04-25 (×4): qty 2

## 2014-04-25 MED ORDER — ALBUTEROL SULFATE HFA 108 (90 BASE) MCG/ACT IN AERS: 2.0000 | INHALATION_SPRAY | Freq: Four times a day (QID) | RESPIRATORY_TRACT | Status: DC | PRN

## 2014-04-25 MED ORDER — STROKE: EARLY STAGES OF RECOVERY BOOK
Freq: Once | Status: AC
  Administered 2014-04-25: 20:00:00
  Filled 2014-04-25: qty 1

## 2014-04-25 MED ORDER — PREDNISONE 50 MG PO TABS
50.0000 mg | ORAL_TABLET | Freq: Every day | ORAL | Status: DC
  Administered 2014-04-26 – 2014-04-28 (×3): 50 mg via ORAL
  Filled 2014-04-25 (×3): qty 1

## 2014-04-25 MED ORDER — AMLODIPINE BESYLATE 10 MG PO TABS: 10.0000 mg | ORAL_TABLET | Freq: Every day | ORAL | Status: DC

## 2014-04-25 MED ORDER — ONDANSETRON HCL 4 MG/2ML IJ SOLN: 4.0000 mg | Freq: Four times a day (QID) | INTRAMUSCULAR | Status: DC | PRN

## 2014-04-25 MED ORDER — SODIUM CHLORIDE 0.9 % IJ SOLN
3.0000 mL | Freq: Two times a day (BID) | INTRAMUSCULAR | Status: DC
  Administered 2014-04-25 – 2014-04-27 (×3): 3 mL via INTRAVENOUS

## 2014-04-25 MED ORDER — GUAIFENESIN ER 600 MG PO TB12
600.0000 mg | ORAL_TABLET | Freq: Two times a day (BID) | ORAL | Status: DC
  Administered 2014-04-25 – 2014-04-28 (×6): 600 mg via ORAL
  Filled 2014-04-25 (×6): qty 1

## 2014-04-25 MED ORDER — METHYLPREDNISOLONE SODIUM SUCC 125 MG IJ SOLR
125.0000 mg | Freq: Once | INTRAMUSCULAR | Status: AC
  Administered 2014-04-25: 125 mg via INTRAVENOUS
  Filled 2014-04-25: qty 2

## 2014-04-25 MED ORDER — AZITHROMYCIN 500 MG IV SOLR
500.0000 mg | Freq: Once | INTRAVENOUS | Status: AC
  Administered 2014-04-25: 500 mg via INTRAVENOUS
  Filled 2014-04-25: qty 500

## 2014-04-25 MED ORDER — ASPIRIN 325 MG PO TABS
325.0000 mg | ORAL_TABLET | Freq: Every day | ORAL | Status: DC
  Administered 2014-04-26 – 2014-04-28 (×3): 325 mg via ORAL
  Filled 2014-04-25 (×3): qty 1

## 2014-04-25 MED ORDER — ACETAMINOPHEN 325 MG PO TABS: 650.0000 mg | ORAL_TABLET | ORAL | Status: DC | PRN

## 2014-04-25 MED ORDER — MOMETASONE FURO-FORMOTEROL FUM 200-5 MCG/ACT IN AERO
2.0000 | INHALATION_SPRAY | Freq: Two times a day (BID) | RESPIRATORY_TRACT | Status: DC
  Administered 2014-04-25 – 2014-04-28 (×6): 2 via RESPIRATORY_TRACT
  Filled 2014-04-25: qty 8.8

## 2014-04-25 MED ORDER — VANCOMYCIN HCL 10 G IV SOLR
1250.0000 mg | INTRAVENOUS | Status: DC
  Administered 2014-04-26 (×2): 1250 mg via INTRAVENOUS
  Filled 2014-04-25 (×3): qty 1250

## 2014-04-25 MED ORDER — IPRATROPIUM-ALBUTEROL 0.5-2.5 (3) MG/3ML IN SOLN
3.0000 mL | RESPIRATORY_TRACT | Status: DC
  Administered 2014-04-25: 3 mL via RESPIRATORY_TRACT
  Filled 2014-04-25: qty 3

## 2014-04-25 MED ORDER — DEXTROSE 5 % IV SOLN
1.0000 g | Freq: Once | INTRAVENOUS | Status: AC
  Administered 2014-04-25: 1 g via INTRAVENOUS
  Filled 2014-04-25: qty 10

## 2014-04-25 MED ORDER — SENNOSIDES-DOCUSATE SODIUM 8.6-50 MG PO TABS: 1.0000 | ORAL_TABLET | Freq: Every evening | ORAL | Status: DC | PRN

## 2014-04-25 MED ORDER — HEPARIN SODIUM (PORCINE) 5000 UNIT/ML IJ SOLN
5000.0000 [IU] | Freq: Three times a day (TID) | INTRAMUSCULAR | Status: DC
  Administered 2014-04-25 – 2014-04-28 (×8): 5000 [IU] via SUBCUTANEOUS
  Filled 2014-04-25 (×7): qty 1

## 2014-04-25 MED ORDER — ASPIRIN 81 MG PO CHEW
324.0000 mg | CHEWABLE_TABLET | Freq: Once | ORAL | Status: AC
  Administered 2014-04-25: 324 mg via ORAL
  Filled 2014-04-25: qty 4

## 2014-04-25 MED ORDER — LEVOFLOXACIN IN D5W 750 MG/150ML IV SOLN: 750.0000 mg | INTRAVENOUS | Status: DC

## 2014-04-25 MED ORDER — SODIUM CHLORIDE 0.9 % IV BOLUS (SEPSIS)
1000.0000 mL | Freq: Once | INTRAVENOUS | Status: AC
  Administered 2014-04-25: 1000 mL via INTRAVENOUS

## 2014-04-25 NOTE — Consult Note (Signed)
Referring Physician: Dr Clementeen Graham    Chief Complaint: Left arm and leg weakness  HPI: Luke Pierce is a 68 y.o. male who presents to emergency department today for evaluation of left arm and leg weakness. The patient states that he started having difficulties with his left arm around Thanksgiving. He did not report this to his family because he did not want to disrupt the holidays. He states his symptoms of been gradually getting worse. Approximately 1 week after he developed left arm problems his left leg started to "act up". Apparently his leg has been intermittently weak; however, he is still able to walk; although he has to be more careful to avoid falling. He denies pain in either extremity. He feels that the weakness is becoming worse. He denies other associated symptoms except for lower extremity edema which is been going on for several weeks and increased shortness of breath. He has a cough that has been nonproductive. He does have a history COPD and congestive heart failure. He takes aspirin occasionally but not on a regular basis.  Date last known well: Unable to determine Time last known well: Unable to determine tPA Given: No: Beyond the window for therapeutic treatment.  Past Medical History  Diagnosis Date  . COPD (chronic obstructive pulmonary disease)   . Hypertension   . EtOH dependence   . Tobacco dependence   . CHF (congestive heart failure)     History reviewed. No pertinent past surgical history.  Family history  - No family history of strokes. His mother died at age 76 with dementia. His father lived to be 11.   Social History:. He has a 45 pack-year smoking history. He continues to smoke 6-10 cigarettes per day. He has never used smokeless tobacco. He drinks "a couple of screwdrivers each evening"  He reports that he does not use illicit drugs.  Allergies: No Known Allergies  Medications:  Current Facility-Administered Medications  Medication Dose Route Frequency  Provider Last Rate Last Dose  . ceFEPIme (MAXIPIME) 1 g in dextrose 5 % 50 mL IVPB  1 g Intravenous 3 times per day Deboraha Sprang, [facility]      . cefTRIAXone (ROCEPHIN) 1 g in dextrose 5 % 50 mL IVPB  1 g Intravenous Once Tatyana A Kirichenko, PA-C 100 mL/hr at 04/25/14 1742 1 g at 04/25/14 1742  . nicotine (NICODERM CQ - dosed in mg/24 hours) patch 14 mg  14 mg Transdermal Daily Nishant Dhungel, MD   14 mg at 04/25/14 1743  . vancomycin (VANCOCIN) 1,250 mg in sodium chloride 0.9 % 250 mL IVPB  1,250 mg Intravenous Q24H Deboraha Sprang, [facility]       Current Outpatient Prescriptions  Medication Sig Dispense Refill  . albuterol (PROAIR HFA) 108 (90 BASE) MCG/ACT inhaler Inhale 2 puffs into the lungs every 6 (six) hours as needed for wheezing or shortness of breath.    Marland Kitchen amLODipine (NORVASC) 10 MG tablet TAKE 1 TABLET EVERY DAY (Patient taking differently: Take 10 mg by mouth daily. TAKE 1 TABLET EVERY DAY) 90 tablet 3  . benazepril (LOTENSIN) 40 MG tablet TAKE 1 TABLET (40 MG TOTAL) BY MOUTH DAILY. 90 tablet 2  . fluticasone-salmeterol (ADVAIR HFA) 230-21 MCG/ACT inhaler Inhale 2 puffs into the lungs 2 (two) times daily. 1 Inhaler 11  . hydrochlorothiazide (HYDRODIURIL) 25 MG tablet Take 1 tablet by mouth daily.  Needs a BP check 30 tablet 0  . clindamycin (CLEOCIN) 300 MG capsule Take 1 capsule (300 mg total)  by mouth 3 (three) times daily. (Patient not taking: Reported on 04/25/2014) 30 capsule 0     ROS: History obtained from the patient  General ROS: negative for - chills, fatigue, fever, night sweats, weight gain or weight loss Psychological ROS: negative for - behavioral disorder, hallucinations, memory difficulties, mood swings or suicidal ideation Ophthalmic ROS: negative for - blurry vision, double vision, eye pain or loss of vision ENT ROS: negative for - epistaxis, nasal discharge, oral lesions, sore throat, tinnitus or vertigo Allergy and Immunology ROS: negative for - hives or  itchy/watery eyes Hematological and Lymphatic ROS: negative for - bleeding problems, bruising or swollen lymph nodes Endocrine ROS: negative for - galactorrhea, hair pattern changes, polydipsia/polyuria or temperature intolerance Respiratory ROS: COPD with chronic shortness of breath which has been worse over the past couple of weeks. Denies orthopnea. Frequent nonproductive cough. Cardiovascular ROS: negative for - chest pain, dyspnea on exertion, or irregular heartbeat. Increased bilateral lower extremity edema 2 weeks Gastrointestinal ROS: negative for - abdominal pain,hematemesis, nausea/vomiting or stool incontinence. Intermittent diarrhea for 2 weeks. Genito-Urinary ROS: negative for - dysuria, hematuria, incontinence or urinary frequency/urgency Musculoskeletal ROS: negative for - joint swelling or muscular weakness Neurological ROS: as noted in HPI Dermatological ROS: negative for rash and skin lesion changes   Physical Examination: Blood pressure 116/75, pulse 71, temperature 98 F (36.7 C), temperature source Oral, resp. rate 14, height 5\' 11"  (1.803 m), weight 161 lb (73.029 kg), SpO2 90 %.  Neurologic Examination: Mental Status: Alert, oriented, thought content appropriate.  Speech fluent without evidence of aphasia.  Able to follow 3 step commands without difficulty. Cranial Nerves: II: Discs not visualized; Visual fields grossly normal, pupils equal, round, reactive to light and accommodation III,IV, VI: ptosis not present, extra-ocular motions intact bilaterally V,VII: smile symmetric, facial light touch sensation normal bilaterally VIII: hearing normal bilaterally IX,X: gag reflex present XI: bilateral shoulder shrug XII: midline tongue extension Motor: Right : Upper extremity   5/5    Left:     Upper extremity   3-4/5       grip strength 3 over 5 bilaterally  Lower extremity   5/5     Lower extremity   4+/5 Tone and bulk:normal tone throughout; no atrophy  noted Sensory: Intact to light touch bilaterally Deep Tendon Reflexes: 2+ and symmetric throughout Cerebellar: Finger to nose with significant tremor bilaterally but worse with the left upper extremity secondary to weakness. Gait: Deferred at this time EXTS - 2-3+ pitting edema bilaterally with pretibial erythema consistent with cellulitis.  Laboratory Studies:  Basic Metabolic Panel:  Recent Labs Lab 04/25/14 1408  NA 123*  K 5.2  CL 78*  CO2 32  GLUCOSE 86  BUN 67*  CREATININE 1.38*  CALCIUM 8.4    Liver Function Tests:  Recent Labs Lab 04/25/14 1408  AST 32  ALT 37  ALKPHOS 53  BILITOT 0.9  PROT 6.2  ALBUMIN 3.5   No results for input(s): LIPASE, AMYLASE in the last 168 hours. No results for input(s): AMMONIA in the last 168 hours.  CBC:  Recent Labs Lab 04/25/14 1408  WBC 6.4  NEUTROABS 5.2  HGB 18.1*  HCT 52.2*  MCV 90.0  PLT 150    Cardiac Enzymes:  Recent Labs Lab 04/25/14 1436  TROPONINI <0.30    BNP: Invalid input(s): POCBNP  CBG: No results for input(s): GLUCAP in the last 168 hours.  Microbiology: Results for orders placed or performed in visit on 02/23/14  Wound culture  Status: None   Collection Time: 02/23/14  9:39 AM  Result Value Ref Range Status   Gram Stain No WBC Seen  Final   Gram Stain No Squamous Epithelial Cells Seen  Final   Gram Stain No Organisms Seen  Final   Organism ID, Bacteria Multiple Organisms Present,None Predominant  Final    Comment: No Staphylococcus aureus isolated NO GROUP A STREP (S. PYOGENES) ISOLATED    Coagulation Studies:  Recent Labs  04/25/14 1408  LABPROT 15.3*  INR 1.20    Urinalysis: No results for input(s): COLORURINE, LABSPEC, PHURINE, GLUCOSEU, HGBUR, BILIRUBINUR, KETONESUR, PROTEINUR, UROBILINOGEN, NITRITE, LEUKOCYTESUR in the last 168 hours.  Invalid input(s): APPERANCEUR  Lipid Panel:    Component Value Date/Time   CHOL 140 06/04/2013 0944   TRIG 42 06/04/2013 0944    HDL 91 06/04/2013 0944   CHOLHDL 1.5 06/04/2013 0944   VLDL 8 06/04/2013 0944   LDLCALC 41 06/04/2013 0944    HgbA1C: No results found for: HGBA1C  Urine Drug Screen:  No results found for: LABOPIA, COCAINSCRNUR, LABBENZ, AMPHETMU, THCU, LABBARB  Alcohol Level: No results for input(s): ETH in the last 168 hours.  Other results: EKG: Pending  Imaging:  Dg Chest 2 View 04/25/2014    Hazy left lower lobe airspace disease at the site of previous airspace abnormality. This may reflect atelectasis versus scarring versus pneumonia.     Ct Head Wo Contrast 04/25/2014    1. No acute intracranial pathology.  2. Mild generalized cerebral atrophy.      Assessment: 68 y.o. male presents to emergency department with an approximate two-week history of left upper and lower extremity weakness, and diarrhea, increased shortness of breath, bilateral lower extremity edema, and right lower extremity cellulitis. Head CT negative. MRI is pending.  Stroke Risk Factors - hypertension, smoking and Alcohol use  Plan: 1. HgbA1c, fasting lipid panel 2. MRI, MRA  of the brain without contrast 3. PT consult, OT consult, Speech consult 4. Echocardiogram 5. Carotid dopplers 6. Prophylactic therapy-Antiplatelet med: Aspirin - dose 81 mg daily. 7. NPO until RN stroke swallow screen 8. Telemetry monitoring 9. Frequent neuro checks   Lowry Ram [facility] Pager [phone] 04/25/2014, 5:21 PM   Patient seen and evaluated. Agree with above assessment and plan. In brief, patient is a 68y/o gentleman presenting with progressive left sided weakness UE>LE. Will admit for stroke workup.   Jim Like, DO [facility] [phone]  If 7pm- 7am, please page neurology on call as listed in [place].

## 2014-04-25 NOTE — ED Provider Notes (Signed)
Medical screening examination/treatment/procedure(s) were conducted as a shared visit with non-physician practitioner(s) and myself.  I personally evaluated the patient during the encounter.     Date: 04/25/2014 13:55  Rate: 75  Rhythm: normal sinus rhythm  QRS Axis: Left axis deviation  Intervals: Prolonged QTC at 589 ms  ST/T Wave abnormalities: normal  Conduction Disutrbances: none  Narrative Interpretation: Sinus rhythm, nonspecific ST flattening, artifact, no old for comparison   Pt is a 68 y.o. M with history of CHF, COPD not on home oxygen, hypertension who presents emergency department with complaints of left arm and leg weakness over the past several days and has been progressively worsening. He is also had exertional dyspnea and bilateral lower extremity swelling. No chest pain. Appears to be a CHF versus COPD exacerbation with new onset hypoxia. Also appears to have left upper extremity weakness as well as mild left lower [place] he weakness with pronator drift. No sensory deficits. No facial droop, dysarthria. Concern for possible CVA that occurred several days ago. Not a TPA candidate. Will obtain labs, chest x-ray, head CT. He will need admission.  [place], DO 04/25/14 1435

## 2014-04-25 NOTE — H&P (Addendum)
[facility] History and Physical  Luke Pierce VQQ:[id number] DOB: 1946-02-26 DOA: 04/25/2014  Referring physician: ED PCP: Ruben Reason, MD   Chief Complaint:  Left sided weakness since 8-9 days Progressive shortness of breath for one week  HPI:  68 year old male with history of COPD, active tobacco use, hypertension presented to the ED with progressive weakness of his left upper extremity followed by his left lower extremity, progressive shortness of breath and leg swellings.  Patient reports being fine 2 weeks back when he started having progressive dyspnea on exertion. This this was associated with nonproductive cough and increasing bilateral leg swellings which she has been noticing for the past 6 -8 weeks. On the day of Thanksgiving (9 days back) he developed some numbness and weakness of his left arm but since he had family in town he did not want to come to the hospital. The weakness was off and on but persistent and progressed with significant weakness on his right upper arm being unable to lift it up above the horizontal. He also started noticing weakness of his left leg for past 2-3 days. For the past 2 days he also dyspnea on minimal exertion and mild orthopnea. Patient denies headache, dizziness, fever, chills, nausea , vomiting, chest pain, palpitations, SOB, abdominal pain, bowel or urinary symptoms. Denies any sick contacts, recent illness or recent travel. He reports being compliant with his medications but not with his diet. He continues to smoke.  Course in the ED Heart rate fluctuated from 60s to 130, blood pressure ranging from 97/54-127/57. O2 sat dropped to 83% on room air improved to 94-96% on 2 L via nasal cannula. A chest x-ray was done which was concerning for left lower lobe airspace disease suggestive of pneumonia. Head CT was unremarkable for acute stroke.  Blood work done showed BCF 6.4, hemoglobin of 18.1, hematocrit of 52.2, platelets of 150, sodium of  123, potassium 5.2, chloride of 78, CO2 32, BUN of 67, creatinine 1.38. I-STAT troponin was mildly elevated to 0.17 but subsequent regular troponin was negative. INR was 1.2.  Patient was given 1 L IV normal saline in the ED followed by IV Rocephin and azithromycin, 1 dose of albuterol nebulizer followed by DuoNeb, 324 mg aspirin and 125 mg IV Solu-Medrol.  Hospitalists admission requested to telemetry.  Review of Systems:  Constitutional: Denies fever, chills, diaphoresis, appetite change and fatigue.  HEENT: Denies photophobia, eye pain, redness, hearing loss, ear pain, congestion, sore throat, rhinorrhea, sneezing, mouth sores, trouble swallowing, neck pain, neck stiffness and tinnitus.   Respiratory: Denies SOB, DOE, cough, chest tightness,  and wheezing.   Cardiovascular: Denies chest pain, palpitations and leg swelling.  Gastrointestinal: Denies nausea, vomiting, abdominal pain, diarrhea, constipation, blood in stool and abdominal distention.  Genitourinary: Denies dysuria, urgency, frequency, hematuria, flank pain and difficulty urinating.  Endocrine: Denies: hot or cold intolerance, sweats, changes in hair or nails, polyuria, polydipsia. Musculoskeletal: Denies myalgias, back pain, joint swelling, arthralgias and gait problem.  Skin: Denies pallor, rash and wound.  Neurological: Denies dizziness, seizures, syncope, weakness, light-headedness, numbness and headaches.  Hematological: Denies adenopathy. Easy bruising, personal or family bleeding history  Psychiatric/Behavioral: Denies suicidal ideation, mood changes, confusion, nervousness, sleep disturbance and agitation   Past Medical History  Diagnosis Date  . COPD (chronic obstructive pulmonary disease)   . Hypertension   . EtOH dependence   . Tobacco dependence   . CHF (congestive heart failure)    History reviewed. No pertinent past surgical history. Social History:  reports  that he quit smoking about 7 months ago. His  smoking use included Cigarettes. He has a 45 pack-year smoking history. He has never used smokeless tobacco. He reports that he drinks about 6.0 oz of alcohol per week. He reports that he does not use illicit drugs.  No Known Allergies  No family history on file.  Prior to Admission medications   Medication Sig Start Date End Date Taking? Authorizing Provider  albuterol (PROAIR HFA) 108 (90 BASE) MCG/ACT inhaler Inhale 2 puffs into the lungs every 6 (six) hours as needed for wheezing or shortness of breath.   Yes Historical Provider, MD  amLODipine (NORVASC) 10 MG tablet TAKE 1 TABLET EVERY DAY Patient taking differently: Take 10 mg by mouth daily. TAKE 1 TABLET EVERY DAY 06/04/13  Yes Posey Boyer, MD  benazepril (LOTENSIN) 40 MG tablet TAKE 1 TABLET (40 MG TOTAL) BY MOUTH DAILY.   Yes Collene Leyden, PA-C  fluticasone-salmeterol (ADVAIR HFA) 230-21 MCG/ACT inhaler Inhale 2 puffs into the lungs 2 (two) times daily. 09/08/13  Yes Chesley Mires, MD  hydrochlorothiazide (HYDRODIURIL) 25 MG tablet Take 1 tablet by mouth daily.  Needs a BP check 04/06/14  Yes Fara Chute, PA-C            Physical Exam:  Filed Vitals:   04/25/14 1600 04/25/14 1615 04/25/14 1630 04/25/14 1645  BP: 109/61 120/56 107/56 116/75  Pulse: 139  71   Temp:      TempSrc:      Resp: 14 18 21 14   Height:      Weight:      SpO2: 90%  90%     Constitutional: Vital signs reviewed. Elderly male lying in bed in no acute distress HEENT: no pallor, no icterus, moist oral mucosa, no cervical lymphadenopathy, JVD+ Cardiovascular: RRR, S1 normal, S2 normal, no MRG Chest: Diminished bibasilar breath sounds with fine crackles, no rhonchi or wheeze Abdominal: Soft. Non-tender, non-distended, bowel sounds are normal, Ext: warm, 2+ pitting edema up to upper tibia bilaterally, erythema with mild warmth over left lower leg  Neurological: Alert and oriented, cranial most 2-12 intact, power 3/5 in left upper extremity unable  to raise the arm above horizontal, normal finger grasp bilaterally, 4+/5 power in left lower extremity, plantar down going bilaterally, good reflexes bilaterally, normal sensations, no neck rigidity, cerebellar function intact, gait not assessed  Labs on Admission:  Basic Metabolic Panel:  Recent Labs Lab 04/25/14 1408  NA 123*  K 5.2  CL 78*  CO2 32  GLUCOSE 86  BUN 67*  CREATININE 1.38*  CALCIUM 8.4   Liver Function Tests:  Recent Labs Lab 04/25/14 1408  AST 32  ALT 37  ALKPHOS 53  BILITOT 0.9  PROT 6.2  ALBUMIN 3.5   No results for input(s): LIPASE, AMYLASE in the last 168 hours. No results for input(s): AMMONIA in the last 168 hours. CBC:  Recent Labs Lab 04/25/14 1408  WBC 6.4  NEUTROABS 5.2  HGB 18.1*  HCT 52.2*  MCV 90.0  PLT 150   Cardiac Enzymes:  Recent Labs Lab 04/25/14 1436  TROPONINI <0.30   BNP: Invalid input(s): POCBNP CBG: No results for input(s): GLUCAP in the last 168 hours.  Radiological Exams on Admission: Dg Chest 2 View  04/25/2014   CLINICAL DATA:  Shortness of breath, history of CHF and COPD. Progressive weakness. Bilateral leg swelling.  EXAM: CHEST  2 VIEW  COMPARISON:  08/17/2013  FINDINGS: The lungs are hyperinflated likely secondary to  COPD. There is hazy left lower lobe airspace disease. There is no pleural effusion or pneumothorax. There is mild bilateral diffuse interstitial thickening which is likely chronic. The heart and mediastinal contours are unremarkable.  The osseous structures are unremarkable.  IMPRESSION: Hazy left lower lobe airspace disease at the site of previous airspace abnormality. This may reflect atelectasis versus scarring versus pneumonia.   Electronically Signed   By: Kathreen Devoid   On: 04/25/2014 15:30   Ct Head Wo Contrast  04/25/2014   CLINICAL DATA:  Left arm weakness, denies fall or trauma, large hematoma over the left forearm.  EXAM: CT HEAD WITHOUT CONTRAST  TECHNIQUE: Contiguous axial images were  obtained from the base of the skull through the vertex without intravenous contrast.  COMPARISON:  None.  FINDINGS: There is no evidence of mass effect, midline shift, or extra-axial fluid collections. There is no evidence of a space-occupying lesion or intracranial hemorrhage. There is no evidence of a cortical-based area of acute infarction. There is generalized cerebral atrophy.  The ventricles and sulci are appropriate for the patient's age. The basal cisterns are patent.  Visualized portions of the orbits are unremarkable. The visualized portions of the paranasal sinuses and mastoid air cells are unremarkable. Cerebrovascular atherosclerotic calcifications are noted.  The osseous structures are unremarkable.  IMPRESSION: 1. No acute intracranial pathology. 2. Mild generalized cerebral atrophy.   Electronically Signed   By: Kathreen Devoid   On: 04/25/2014 15:54    EKG: Sinus rhythm at 75,, LAFB, long QTC of 527. No old EKG to compare  Assessment/Plan  Principal Problem:   CVA (cerebral vascular accident) Admit to telemetry Neurochecks every 2 hours for next 24 hours. Head CT on admission negative for acute event. Check MRI brain, MRA head. -Check 2-D echo, carotid Doppler, hemoglobin A1c and lipid panel -Bedside swallow eval, PT/OT eval -Place on full dose aspirin. Check lipid panel. Allow permissive blood pressure -Neurology consulted by ED. Will follow with recommendations.  Active Problems:  Acute CHF exacerbation, NYHA class 4 New onset -Admit to telemetry -Start on IV lasix 40 mg placed daily. Check proBNP. Monitor daily weight and strict I/O -Check 2D echo. -place on ASA and bisoprolol. Hold ACE inhibitor due to acute kidney injury. Check lipid panel.   Community acquired pneumonia Patient received IV Rocephin and azithromycin in the ED. given prolonged QTC will not use quinolones and macrolides. Place on cefepime along with vancomycin.. Check blood culture, urine for strep antigen  and Legionella. Check HIV antibody. Supportive care with Tylenol and antitussives  Cellulitis of right lower leg Place  on empiric IV vancomycin. Monitor clinically  Hyponatremia  Likely hypervolemic . Monitor with IV diuresis and strict fluid restriction. Likely also contributed by use of HCTZ which is on hold. Check UA, urine and sodium osmolality.   Acute kidney injury Mild. Check UA and urine lites.   Hypertension Allow permissive blood pressure. Hold HCTZ, amlodipine and ACE inhibitor. On IV Lasix and bisoprolol.    COPD with mild exacerbation  Place on scheduled DuoNeb and when necessary nebs. When necessary antitussives. Would place on oral prednisone 50 mg once a day. Maintaining O2 sat between 92-96% on 2 L via nasal cannula. Patient not on home O2. Continues to smoke half a pack per day. Contrast was strongly on cessation. Order nicotine patch  Elevated I stat troponin Likely nonspecific. Subsequent Tropinin I was negative. Denies chest pain. EKG unremarkable. Cycle cardiac enzymes.  Hyperkalemia Mild. Asymptomatic. Monitor with diuresis.  Polycythemia Possibly in the setting of COPD. Will monitor for now . No intervention needed    Diet:cardiac with fluid restriction to 1000cc/ day  DVT prophylaxis: sq heparin   Code Status: Full code Family Communication: None at bedside Disposition Plan: Admit to telemetry  Glenroy Crossen, [facility] Pager [phone]  Total time spent on admission :70 minutes  If 7PM-7AM, please contact night-coverage [email or web address] Password [email or web address] 04/25/2014, 5:13 PM

## 2014-04-25 NOTE — ED Notes (Signed)
Admitting at bedside 

## 2014-04-25 NOTE — Progress Notes (Signed)
Pt transferred to unit from ED via RN x 1. Pt alert and oriented upon arrival. No complaints of pain or discomfort. No signs or symptoms of acute distress. Pt connected to telemetry and central monitoring notified. Pt oriented to unit and procedures. Pt now resting in bed at lowest position, bed alarm on, call light within reach. Will continue to monitor. Fortino Sic, RN, BSN 04/25/2014 7:12 PM

## 2014-04-25 NOTE — ED Provider Notes (Signed)
CSN: [id number]     Arrival date & time 04/25/14  1346 History   First MD Initiated Contact with Patient 04/25/14 1348     Chief Complaint  Patient presents with  . Shortness of Breath     (Consider location/radiation/quality/duration/timing/severity/associated sxs/prior Treatment) HPI Tevon Berhane is a 68 y.o. male with history of COPD, hypertension, CHF, presents to emergency department with 2 complaints, left arm weakness and shortness of breath. He states his left arm weakness began 2 weeks ago. He states that he did not want to bother his family over the holidays so he did not tell anyone. States is progressively getting worse to the point where he cannot use his left arm. He denies any pain in the arm. States he is having some numbness. Patient also reports worsening shortness of breath over last several weeks. States he has COPD and he is currently taking albuterol and Advair with no relief of his symptoms. He reports productive cough. Denies any fever, chills. Denies any chest pain. He has not tried any treatment prior to coming in, other than his regular inhalers.  Past Medical History  Diagnosis Date  . COPD (chronic obstructive pulmonary disease)   . Hypertension   . EtOH dependence   . Tobacco dependence   . CHF (congestive heart failure)    History reviewed. No pertinent past surgical history. No family history on file. History  Substance Use Topics  . Smoking status: Former Smoker -- 1.00 packs/day for 45 years    Types: Cigarettes    Quit date: 08/25/2013  . Smokeless tobacco: Never Used  . Alcohol Use: 6.0 oz/week    10 Cans of beer per week    Review of Systems  Constitutional: Negative for fever and chills.  Respiratory: Positive for cough and shortness of breath. Negative for chest tightness.   Cardiovascular: Negative for chest pain, palpitations and leg swelling.  Gastrointestinal: Negative for nausea, vomiting, abdominal pain, diarrhea and abdominal  distention.  Genitourinary: Negative for dysuria, urgency, frequency and hematuria.  Musculoskeletal: Negative for myalgias, arthralgias, neck pain and neck stiffness.  Skin: Negative for rash.  Allergic/Immunologic: Negative for immunocompromised state.  Neurological: Positive for weakness and numbness. Negative for dizziness, light-headedness and headaches.  All other systems reviewed and are negative.     Allergies  Review of patient's allergies indicates no known allergies.  Home Medications   Prior to Admission medications   Medication Sig Start Date End Date Taking? Authorizing Provider  albuterol (PROAIR HFA) 108 (90 BASE) MCG/ACT inhaler Inhale 2 puffs into the lungs every 6 (six) hours as needed for wheezing or shortness of breath.    Historical Provider, MD  amLODipine (NORVASC) 10 MG tablet TAKE 1 TABLET EVERY DAY 06/04/13   Posey Boyer, MD  benazepril (LOTENSIN) 40 MG tablet TAKE 1 TABLET (40 MG TOTAL) BY MOUTH DAILY.    Heather M Marte, PA-C  clindamycin (CLEOCIN) 300 MG capsule Take 1 capsule (300 mg total) by mouth 3 (three) times daily. 02/23/14   Darlyne Russian, MD  fluticasone-salmeterol (ADVAIR HFA) [phone] MCG/ACT inhaler Inhale 2 puffs into the lungs 2 (two) times daily. 09/08/13   Chesley Mires, MD  hydrochlorothiazide (HYDRODIURIL) 25 MG tablet Take 1 tablet by mouth daily.  Needs a BP check 04/06/14   Chelle S Jeffery, PA-C   BP 127/57 mmHg  Pulse 79  Temp(Src) 98 F (36.7 C) (Oral)  Resp 18  Ht 5\' 11"  (1.803 m)  Wt 161 lb (73.029 kg)  BMI 22.46 kg/m2  SpO2 92% Physical Exam  Constitutional: He is oriented to person, place, and time. He appears well-developed and well-nourished. No distress.  HENT:  Head: Normocephalic and atraumatic.  Eyes: Conjunctivae are normal.  Neck: Neck supple.  Cardiovascular: Normal rate, regular rhythm and normal heart sounds.   Pulmonary/Chest: Effort normal. No respiratory distress. He has wheezes. He has no rales.  Mild end  expiratory wheezing bilaterally, decreased air movement bilaterally.  Abdominal: Soft. Bowel sounds are normal. He exhibits no distension. There is no tenderness. There is no rebound.  Musculoskeletal: He exhibits no edema.  Neurological: He is alert and oriented to person, place, and time. A sensory deficit is present. No cranial nerve deficit. GCS eye subscore is 4. GCS verbal subscore is 5. GCS motor subscore is 6.  Decreased strength in left arm, particularly in left deltoids. Patient unable to passively raise his arm at the shoulder joint. Decreased left grip strength compared to the right. Biceps strength is intact. Decreased sensation over her left arm. Pronator drift present in the left arm. Decreased strength with left hip flexion against resistance. Strength of left foot plantar flexion dorsiflexion intact and equal compared to the right. Normal finger to nose.  Skin: Skin is warm and dry.  Extensive skin bruising to bilateral upper extremities.  Nursing note and vitals reviewed.   ED Course  Procedures (including critical care time) Labs Review Labs Reviewed  CBC WITH DIFFERENTIAL - Abnormal; Notable for the following:    Hemoglobin 18.1 (*)    HCT 52.2 (*)    Neutrophils Relative % 81 (*)    Lymphocytes Relative 8 (*)    Lymphs Abs 0.5 (*)    All other components within normal limits  COMPREHENSIVE METABOLIC PANEL - Abnormal; Notable for the following:    Sodium 123 (*)    Chloride 78 (*)    BUN 67 (*)    Creatinine, Ser 1.38 (*)    GFR calc non Af Amer 51 (*)    GFR calc Af Amer 59 (*)    All other components within normal limits  PROTIME-INR - Abnormal; Notable for the following:    Prothrombin Time 15.3 (*)    All other components within normal limits  I-STAT TROPOININ, ED - Abnormal; Notable for the following:    Troponin i, poc 0.17 (*)    All other components within normal limits  TROPONIN I    Imaging Review Dg Chest 2 View  04/25/2014   CLINICAL DATA:   Shortness of breath, history of CHF and COPD. Progressive weakness. Bilateral leg swelling.  EXAM: CHEST  2 VIEW  COMPARISON:  08/17/2013  FINDINGS: The lungs are hyperinflated likely secondary to COPD. There is hazy left lower lobe airspace disease. There is no pleural effusion or pneumothorax. There is mild bilateral diffuse interstitial thickening which is likely chronic. The heart and mediastinal contours are unremarkable.  The osseous structures are unremarkable.  IMPRESSION: Hazy left lower lobe airspace disease at the site of previous airspace abnormality. This may reflect atelectasis versus scarring versus pneumonia.   Electronically Signed   By: Kathreen Devoid   On: 04/25/2014 15:30   Ct Head Wo Contrast  04/25/2014   CLINICAL DATA:  Left arm weakness, denies fall or trauma, large hematoma over the left forearm.  EXAM: CT HEAD WITHOUT CONTRAST  TECHNIQUE: Contiguous axial images were obtained from the base of the skull through the vertex without intravenous contrast.  COMPARISON:  None.  FINDINGS: There  is no evidence of mass effect, midline shift, or extra-axial fluid collections. There is no evidence of a space-occupying lesion or intracranial hemorrhage. There is no evidence of a cortical-based area of acute infarction. There is generalized cerebral atrophy.  The ventricles and sulci are appropriate for the patient's age. The basal cisterns are patent.  Visualized portions of the orbits are unremarkable. The visualized portions of the paranasal sinuses and mastoid air cells are unremarkable. Cerebrovascular atherosclerotic calcifications are noted.  The osseous structures are unremarkable.  IMPRESSION: 1. No acute intracranial pathology. 2. Mild generalized cerebral atrophy.   Electronically Signed   By: Kathreen Devoid   On: 04/25/2014 15:54     EKG Interpretation None      MDM   Final diagnoses:  Shortness of breath  Arm weakness  Hypoxia  Hyponatremia   2:00 PM Patient is here with 2  separate complaints, states having left arm and left leg weakness for several weeks. He also states he is having shortness of breath that is worsening and spin gone on for several months. He does have COPD. On initial exam decreased air movement bilaterally with some expiratory wheezes. Will start neb treatments, the labs, CT head, chest x-ray ordered.  4:47 PM Patient CT of the head is negative. Chest x-ray showed possible pneumonia, will cover with Rocephin and Zithromax. No recent admissions. Received 2 neb treatments now, will start another one. Solu-Medrol ordered. Lab work shows hyponatremia. Point-of-care troponin slightly elevated, left troponin is negative. EKG with no acute findings. Given persistent left arm weakness and left leg weakness, hypoxia, malignant. Spoke with tried hospitalist will admit patient. Asked for neurology consult.  Spoke with neurology, will consult.   Filed Vitals:   04/25/14 1600 04/25/14 1615 04/25/14 1630 04/25/14 1645  BP: 109/61 120/56 107/56 116/75  Pulse: 139  71   Temp:      TempSrc:      Resp: 14 18 21 14   Height:      Weight:      SpO2: 90%  90%       Renold Genta, PA-C 04/25/14 1651

## 2014-04-25 NOTE — ED Notes (Signed)
Per EMS pt from home, has a hx of CHF & COPD called out for shob and progressive weakness in left leg over the past week. Pt has exertional dyspnea worsening over the past month, bilateral leg swelling. Upper lobes- tight, lower lobes- congested. Pt given 5mg  albuterol neb.

## 2014-04-25 NOTE — Consult Note (Addendum)
ANTIBIOTIC CONSULT NOTE - INITIAL  Pharmacy Consult for Levaquin-->Cefepime and Vancomycin Indication: CAP and cellulitis  No Known Allergies  Patient Measurements: Height: 5\' 11"  (180.3 cm) Weight: 161 lb (73.029 kg) IBW/kg (Calculated) : 75.3  Vital Signs: Temp: 98 F (36.7 C) (12/05 1353) Temp Source: Oral (12/05 1353) BP: 113/60 mmHg (12/05 1715) Pulse Rate: 66 (12/05 1715) Intake/Output from previous day:   Intake/Output from this shift:    Labs:  Recent Labs  04/25/14 1408  WBC 6.4  HGB 18.1*  PLT 150  CREATININE 1.38*   Estimated Creatinine Clearance: 52.9 mL/min (by C-G formula based on Cr of 1.38).   Microbiology: No results found for this or any previous visit (from the past 720 hour(s)).  Medical History: Past Medical History  Diagnosis Date  . COPD (chronic obstructive pulmonary disease)   . Hypertension   . EtOH dependence   . Tobacco dependence   . CHF (congestive heart failure)    Assessment: 68yom presented to the ED with SOB and left leg weakness. CXR shows possible pneumonia. He will begin levaquin to cover for CAP. He will also begin vancomycin to cover for LE cellulitis. Renal insufficiency noted with sCr 1.38, CrCl 85ml/min.  Goal of Therapy:  Vancomycin trough level 10-15 mcg/ml  Plan:  1) Levaquin 750mg  IV q24 2) Vancomcyin 1250mg  IV q24 3) Follow renal function, cultures, LOT, level if needed  Deboraha Sprang 04/25/2014,5:29 PM  Addendum: Patient just had an EKG and his QT is prolonged so Dr. Clementeen Graham wants to change from levaquin to cefepime.  Plan: 1) Cefepime 1g IV q8  Deboraha Sprang 04/25/2014, 5:42 PM

## 2014-04-26 ENCOUNTER — Inpatient Hospital Stay (HOSPITAL_COMMUNITY): Payer: Medicare Other

## 2014-04-26 DIAGNOSIS — J9601 Acute respiratory failure with hypoxia: Secondary | ICD-10-CM

## 2014-04-26 DIAGNOSIS — I159 Secondary hypertension, unspecified: Secondary | ICD-10-CM

## 2014-04-26 DIAGNOSIS — I369 Nonrheumatic tricuspid valve disorder, unspecified: Secondary | ICD-10-CM

## 2014-04-26 DIAGNOSIS — R29898 Other symptoms and signs involving the musculoskeletal system: Secondary | ICD-10-CM

## 2014-04-26 DIAGNOSIS — N179 Acute kidney failure, unspecified: Secondary | ICD-10-CM

## 2014-04-26 DIAGNOSIS — I10 Essential (primary) hypertension: Secondary | ICD-10-CM

## 2014-04-26 DIAGNOSIS — L03115 Cellulitis of right lower limb: Secondary | ICD-10-CM

## 2014-04-26 DIAGNOSIS — J449 Chronic obstructive pulmonary disease, unspecified: Secondary | ICD-10-CM

## 2014-04-26 DIAGNOSIS — F17200 Nicotine dependence, unspecified, uncomplicated: Secondary | ICD-10-CM

## 2014-04-26 LAB — CBC
HCT: 47.5 % (ref 39.0–52.0)
HEMOGLOBIN: 16.5 g/dL (ref 13.0–17.0)
MCH: 31 pg (ref 26.0–34.0)
MCHC: 34.7 g/dL (ref 30.0–36.0)
MCV: 89.1 fL (ref 78.0–100.0)
Platelets: 136 10*3/uL — ABNORMAL LOW (ref 150–400)
RBC: 5.33 MIL/uL (ref 4.22–5.81)
RDW: 13.6 % (ref 11.5–15.5)
WBC: 3.2 10*3/uL — AB (ref 4.0–10.5)

## 2014-04-26 LAB — BASIC METABOLIC PANEL
ANION GAP: 13 (ref 5–15)
BUN: 53 mg/dL — ABNORMAL HIGH (ref 6–23)
CALCIUM: 8 mg/dL — AB (ref 8.4–10.5)
CO2: 28 meq/L (ref 19–32)
CREATININE: 1.05 mg/dL (ref 0.50–1.35)
Chloride: 84 mEq/L — ABNORMAL LOW (ref 96–112)
GFR calc Af Amer: 82 mL/min — ABNORMAL LOW (ref >90)
GFR calc non Af Amer: 71 mL/min — ABNORMAL LOW (ref >90)
GLUCOSE: 130 mg/dL — AB (ref 70–99)
Potassium: 4.6 mEq/L (ref 3.7–5.3)
SODIUM: 125 meq/L — AB (ref 137–147)

## 2014-04-26 LAB — LIPID PANEL
CHOLESTEROL: 94 mg/dL (ref 0–200)
HDL: 56 mg/dL (ref >39)
LDL Cholesterol: 29 mg/dL (ref 0–99)
TRIGLYCERIDES: 46 mg/dL (ref <150)
Total CHOL/HDL Ratio: 1.7 RATIO
VLDL: 9 mg/dL (ref 0–40)

## 2014-04-26 LAB — HEMOGLOBIN A1C
Hgb A1c MFr Bld: 5.7 % — ABNORMAL HIGH (ref <5.7)
MEAN PLASMA GLUCOSE: 117 mg/dL — AB (ref <117)

## 2014-04-26 LAB — HIV ANTIBODY (ROUTINE TESTING W REFLEX): HIV 1&2 Ab, 4th Generation: NONREACTIVE

## 2014-04-26 LAB — TROPONIN I
Troponin I: <0.3 ng/mL (ref <0.30)
Troponin I: <0.3 ng/mL (ref <0.30)

## 2014-04-26 MED ORDER — GADOBENATE DIMEGLUMINE 529 MG/ML IV SOLN
15.0000 mL | Freq: Once | INTRAVENOUS | Status: AC | PRN
  Administered 2014-04-26: 15 mL via INTRAVENOUS

## 2014-04-26 NOTE — Progress Notes (Signed)
[facility]                                                                              Patient Demographics  Luke Pierce, is a 68 y.o. male, DOB - Oct 02, 1945, VZD:[id number]  Admit date - 04/25/2014   Admitting Physician No admitting provider for patient encounter.  Outpatient Primary MD for the patient is [name],[name], MD  LOS - 1   Chief Complaint  Patient presents with  . Shortness of Breath      HPI on 04/25/2014 by Dr. Flonnie Overman Dhungel 68 year old male with history of COPD, active tobacco use, hypertension presented to the ED with progressive weakness of his left upper extremity followed by his left lower extremity, progressive shortness of breath and leg swellings. Patient reports being fine 2 weeks back when he started having progressive dyspnea on exertion. This this was associated with nonproductive cough and increasing bilateral leg swellings which she has been noticing for the past 6 -8 weeks. On the day of Thanksgiving (9 days back) he developed some numbness and weakness of his left arm but since he had family in town he did not want to come to the hospital. The weakness was off and on but persistent and progressed with significant weakness on his right upper arm being unable to lift it up above the horizontal. He also started noticing weakness of his left leg for past 2-3 days. For the past 2 days he also dyspnea on minimal exertion and mild orthopnea. Patient denies headache, dizziness, fever, chills, nausea , vomiting, chest pain, palpitations, SOB, abdominal pain, bowel or urinary symptoms. Denies any sick contacts, recent illness or recent travel. He reports being compliant with his medications but not with his diet. He continues to smoke.  Assessment & Plan   Left-sided weakness, rule out CVA -Patient continues to have left sided weakness however particularly more in the left upper extremity -CT head: No acute intracranial pathology -MRI brain: Mild atrophy and  small vessel disease, no acute intracranial findings -Pending echocardiogram -Carotid Doppler: 1-39% ICA stenosis, vertebral artery flow is antegrade -Lipid panel: TC 94, TG 46, HDL 56, LDL 29 -Hemoglobin A1c pending -PT and OT consults pending -Neurology consultation appreciated -Continue aspirin (of note, patient states that he was not compliant with his daily aspirin regimen.)  Lower extremity swelling -Patient did have elevated BNP on admission 5975 -He does complain of progressive lower extremity swelling -Pending echocardiogram to evaluate for heart failure -Continue to monitor daily weights, intake and output -Continue Lasix 40 mg IV every 12 hours  Community-acquired pneumonia -CXR: Left lower lobe airspace disease, atelectasis versus chronic versus pneumonia -Patient currently on vancomycin and cefepime, avoiding quinolones due to prolonged QTC -Strep pneumonia antigen negative, Legionella urine antigen pending -Blood and sputum cultures pending  Right lower extremity cellulitis -Appears to be improving, continue vancomycin  Hyponatremia -Likely secondary to volume overload as patient does appear to be hypovolemic -Continue diuresis with strict fluid restriction -Mild improvement  Acute kidney injury -Resolved  Hypertension -Allowing for permissive hypertension, ACE inhibitor, amlodipine and HCTZ currently held -Patient on Lasix as well as bisoprolol  COPD with mild exacerbation -Continue duo nebs as necessary -Continue antitussives, prednisone,  supplemental oxygen, Dulera -Complicated by continued smoking  Tobacco Abuse -Smoking cessation counseling given -Continue nicotine patch  Elevated I-Stat Troponin -Troponins have been negative, patient denies any chest pain at this time  Polycythemia -Patient does have history of smoking and COPD -Continue to monitor CBC  Code Status: Full   Family Communication: None at bedside  Disposition Plan:  Admitted  Time Spent in minutes   30 minutes  Procedures  -Carotid Doppler: 1-39% ICA stenosis, vertebral artery flow is antegrade  Consults   Neurology  DVT Prophylaxis  Heparin  Lab Results  Component Value Date   PLT 136* 04/26/2014    Medications  Scheduled Meds: . aspirin  325 mg Oral Daily  . bisoprolol  20 mg Oral Daily  . ceFEPime (MAXIPIME) IV  1 g Intravenous 3 times per day  . furosemide  40 mg Intravenous Q12H  . guaiFENesin  600 mg Oral BID  . heparin  5,000 Units Subcutaneous 3 times per day  . ipratropium-albuterol  3 mL Nebulization TID  . mometasone-formoterol  2 puff Inhalation BID  . nicotine  14 mg Transdermal Daily  . potassium chloride  20 mEq Oral BID  . predniSONE  50 mg Oral Q breakfast  . sodium chloride  3 mL Intravenous Q12H  . vancomycin  1,250 mg Intravenous Q24H   Continuous Infusions:  PRN Meds:.sodium chloride, acetaminophen, albuterol, ondansetron (ZOFRAN) IV, senna-docusate, sodium chloride  Antibiotics    Anti-infectives    Start     Dose/Rate Route Frequency Ordered Stop   04/25/14 2200  ceFEPIme (MAXIPIME) 1 g in dextrose 5 % 50 mL IVPB     1 g100 mL/hr over 30 Minutes Intravenous 3 times per day 04/25/14 1745     04/25/14 1800  vancomycin (VANCOCIN) 1,250 mg in sodium chloride 0.9 % 250 mL IVPB     1,250 mg166.7 mL/hr over 90 Minutes Intravenous Every 24 hours 04/25/14 1735     04/25/14 1800  levofloxacin (LEVAQUIN) IVPB 750 mg  Status:  Discontinued     750 mg100 mL/hr over 90 Minutes Intravenous Every 24 hours 04/25/14 1735 04/25/14 1744   04/25/14 1545  cefTRIAXone (ROCEPHIN) 1 g in dextrose 5 % 50 mL IVPB     1 g100 mL/hr over 30 Minutes Intravenous  Once 04/25/14 1537 04/25/14 1812   04/25/14 1545  azithromycin (ZITHROMAX) 500 mg in dextrose 5 % 250 mL IVPB     500 mg250 mL/hr over 60 Minutes Intravenous  Once 04/25/14 1537 04/25/14 1742        Subjective:   Luke Pierce seen and examined today.  Patient continues  to complain of left upper extremity weakness. He states his left lower extremity feels stronger. Patient denies any chest pain or shortness of breath at this time.   Objective:   Filed Vitals:   04/26/14 0606 04/26/14 0829 04/26/14 0839 04/26/14 0919  BP: 116/55  91/52   Pulse: 57  50 57  Temp: 98.1 F (36.7 C)  98.6 F (37 C)   TempSrc: Oral  Oral   Resp: 16  16   Height:      Weight:  74.753 kg (164 lb 12.8 oz)    SpO2: 94%  92% 95%    Wt Readings from Last 3 Encounters:  04/26/14 74.753 kg (164 lb 12.8 oz)  02/25/14 73.029 kg (161 lb)  02/24/14 72.576 kg (160 lb)     Intake/Output Summary (Last 24 hours) at 04/26/14 1114 Last data filed at 04/26/14  0853  Gross per 24 hour  Intake    240 ml  Output    250 ml  Net    -10 ml    Exam  General: Well developed, well nourished, NAD, appears stated age  HEENT: NCAT, PERRLA, EOMI, Anicteic Sclera, mucous membranes moist.   Neck: Supple, + JVD, no masses  Cardiovascular: S1 S2 auscultated, no rubs, murmurs or gallops. Regular rate and rhythm.  Respiratory: Diminished breath sounds, fine crackles  Abdomen: Soft, nontender, nondistended, + bowel sounds  Extremities: warm dry without cyanosis clubbing or edema  Neuro: AAOx3, cranial nerves grossly intact. Strength 4/5 LUE, 5/5 in RLE/RUE/LLE  Skin: Multiple bruises however intact and warm, erythema in the right lower extremity improving   Psych: Normal affect and demeanor with intact judgement and insight   Data Review   Micro Results No results found for this or any previous visit (from the past 240 hour(s)).  Radiology Reports Dg Chest 2 View  04/25/2014   CLINICAL DATA:  Shortness of breath, history of CHF and COPD. Progressive weakness. Bilateral leg swelling.  EXAM: CHEST  2 VIEW  COMPARISON:  08/17/2013  FINDINGS: The lungs are hyperinflated likely secondary to COPD. There is hazy left lower lobe airspace disease. There is no pleural effusion or pneumothorax.  There is mild bilateral diffuse interstitial thickening which is likely chronic. The heart and mediastinal contours are unremarkable.  The osseous structures are unremarkable.  IMPRESSION: Hazy left lower lobe airspace disease at the site of previous airspace abnormality. This may reflect atelectasis versus scarring versus pneumonia.   Electronically Signed   By: Kathreen Devoid   On: 04/25/2014 15:30   Ct Head Wo Contrast  04/25/2014   CLINICAL DATA:  Left arm weakness, denies fall or trauma, large hematoma over the left forearm.  EXAM: CT HEAD WITHOUT CONTRAST  TECHNIQUE: Contiguous axial images were obtained from the base of the skull through the vertex without intravenous contrast.  COMPARISON:  None.  FINDINGS: There is no evidence of mass effect, midline shift, or extra-axial fluid collections. There is no evidence of a space-occupying lesion or intracranial hemorrhage. There is no evidence of a cortical-based area of acute infarction. There is generalized cerebral atrophy.  The ventricles and sulci are appropriate for the patient's age. The basal cisterns are patent.  Visualized portions of the orbits are unremarkable. The visualized portions of the paranasal sinuses and mastoid air cells are unremarkable. Cerebrovascular atherosclerotic calcifications are noted.  The osseous structures are unremarkable.  IMPRESSION: 1. No acute intracranial pathology. 2. Mild generalized cerebral atrophy.   Electronically Signed   By: Kathreen Devoid   On: 04/25/2014 15:54   Mr Jodene Nam Head Wo Contrast  04/25/2014   CLINICAL DATA:  Two week history of LEFT upper and lower extremity weakness. Stroke risk factors include hypertension and smoking.  EXAM: MRI HEAD WITHOUT CONTRAST  MRA HEAD WITHOUT CONTRAST  TECHNIQUE: Multiplanar, multiecho pulse sequences of the brain and surrounding structures were obtained without intravenous contrast. Angiographic images of the head were obtained using MRA technique without contrast.   COMPARISON:  CT head earlier today.  FINDINGS: MRI HEAD FINDINGS  No evidence for acute infarction, hemorrhage, mass lesion, hydrocephalus, or extra-axial fluid. Mild cerebral and cerebellar atrophy. Mild subcortical and periventricular T2 and FLAIR hyperintensities, likely chronic microvascular ischemic change. Normal pituitary and cerebellar tonsils. Mild pannus surrounds the odontoid. No areas of chronic hemorrhage. Extracranial soft tissues appear unremarkable.  MRA HEAD FINDINGS  The internal carotid arteries  are mildly irregular in cavernous and supraclinoid segments but there is no flow-limiting stenosis. The basilar artery is widely patent with both vertebrals contributing. Fetal origin LEFT PCA. No proximal intracranial flow-limiting stenosis or aneurysm. Mild irregularity both superior cerebellar artery vessels proximally.  IMPRESSION: Mild atrophy and small vessel disease. No acute intracranial findings.  Mild pannus without visible cervical cord compression.  Minor non stenotic irregularity of the intracranial vasculature without proximal flow limiting stenosis.   Electronically Signed   By: Rolla Flatten M.D.   On: 04/25/2014 19:58   Mr Brain Wo Contrast  04/25/2014   CLINICAL DATA:  Two week history of LEFT upper and lower extremity weakness. Stroke risk factors include hypertension and smoking.  EXAM: MRI HEAD WITHOUT CONTRAST  MRA HEAD WITHOUT CONTRAST  TECHNIQUE: Multiplanar, multiecho pulse sequences of the brain and surrounding structures were obtained without intravenous contrast. Angiographic images of the head were obtained using MRA technique without contrast.  COMPARISON:  CT head earlier today.  FINDINGS: MRI HEAD FINDINGS  No evidence for acute infarction, hemorrhage, mass lesion, hydrocephalus, or extra-axial fluid. Mild cerebral and cerebellar atrophy. Mild subcortical and periventricular T2 and FLAIR hyperintensities, likely chronic microvascular ischemic change. Normal pituitary and  cerebellar tonsils. Mild pannus surrounds the odontoid. No areas of chronic hemorrhage. Extracranial soft tissues appear unremarkable.  MRA HEAD FINDINGS  The internal carotid arteries are mildly irregular in cavernous and supraclinoid segments but there is no flow-limiting stenosis. The basilar artery is widely patent with both vertebrals contributing. Fetal origin LEFT PCA. No proximal intracranial flow-limiting stenosis or aneurysm. Mild irregularity both superior cerebellar artery vessels proximally.  IMPRESSION: Mild atrophy and small vessel disease. No acute intracranial findings.  Mild pannus without visible cervical cord compression.  Minor non stenotic irregularity of the intracranial vasculature without proximal flow limiting stenosis.   Electronically Signed   By: Rolla Flatten M.D.   On: 04/25/2014 19:58    CBC  Recent Labs Lab 04/25/14 1408 04/26/14 0139  WBC 6.4 3.2*  HGB 18.1* 16.5  HCT 52.2* 47.5  PLT 150 136*  MCV 90.0 89.1  MCH 31.2 31.0  MCHC 34.7 34.7  RDW 13.7 13.6  LYMPHSABS 0.5*  --   MONOABS 0.7  --   EOSABS 0.0  --   BASOSABS 0.0  --     Chemistries   Recent Labs Lab 04/25/14 1408 04/26/14 0139  NA 123* 125*  K 5.2 4.6  CL 78* 84*  CO2 32 28  GLUCOSE 86 130*  BUN 67* 53*  CREATININE 1.38* 1.05  CALCIUM 8.4 8.0*  AST 32  --   ALT 37  --   ALKPHOS 53  --   BILITOT 0.9  --    ------------------------------------------------------------------------------------------------------------------ estimated creatinine clearance is 71.2 mL/min (by C-G formula based on Cr of 1.05). ------------------------------------------------------------------------------------------------------------------ No results for input(s): HGBA1C in the last 72 hours. ------------------------------------------------------------------------------------------------------------------  Recent Labs  04/26/14 0139  CHOL 94  HDL 56  LDLCALC 29  TRIG 46  CHOLHDL 1.7    ------------------------------------------------------------------------------------------------------------------ No results for input(s): TSH, T4TOTAL, T3FREE, THYROIDAB in the last 72 hours.  Invalid input(s): FREET3 ------------------------------------------------------------------------------------------------------------------ No results for input(s): VITAMINB12, FOLATE, FERRITIN, TIBC, IRON, RETICCTPCT in the last 72 hours.  Coagulation profile  Recent Labs Lab 04/25/14 1408  INR 1.20    No results for input(s): DDIMER in the last 72 hours.  Cardiac Enzymes  Recent Labs Lab 04/25/14 2010 04/26/14 0139 04/26/14 0914  TROPONINI <0.30 <0.30 <0.30   ------------------------------------------------------------------------------------------------------------------ Luke Pierce  input(s): POCBNP    Luke Pierce D.O. on 04/26/2014 at 11:14 AM  Between 7am to 7pm - Pager - [phone]  After 7pm go to [email or web address] - password [email or web address]  And look for the night coverage person covering for me after hours  [facility] Office  [phone]

## 2014-04-26 NOTE — Progress Notes (Signed)
Utilization review complete. Alesia Shavis RN CCM Case Mgmt phone [phone] 

## 2014-04-26 NOTE — Progress Notes (Signed)
Physical Therapy Evaluation Patient Details Name: Luke Pierce MRN: [id number] DOB: [birth year] Today's Date: 04/26/2014   History of Present Illness  Patient is a 68 yo male admitted 04/25/14 with Lt-sided weakness (UE prior to LE), BLE edema, and SOB.  PMH:  COPD, CHF, HTN, ETOH and tobacco abuse, cellulitis RLE.  Clinical Impression  Patient presents with problems listed below.  Will benefit from acute PT to maximize independence prior to discharge home with wife.  Patient reports he is fearful of falling.  Recommend HHPT at discharge.    Follow Up Recommendations Home health PT;Supervision/Assistance - 24 hour    Equipment Recommendations  None recommended by PT    Recommendations for Other Services       Precautions / Restrictions Precautions Precautions: Fall Restrictions Weight Bearing Restrictions: No      Mobility  Bed Mobility Overal bed mobility: Needs Assistance Bed Mobility: Supine to Sit;Sit to Supine     Supine to sit: Supervision Sit to supine: Supervision   General bed mobility comments: Patient very impulsive, moving before lines are prepared.  Supervision for safety only.  Transfers Overall transfer level: Needs assistance Equipment used: Rolling walker (2 wheeled) Transfers: Sit to/from Stand Sit to Stand: Min assist         General transfer comment: Verbal cues to move slowly and safely during transitions.  Assist for balance and safety.  Ambulation/Gait Ambulation/Gait assistance: Min assist Ambulation Distance (Feet): 76 Feet Assistive device: Rolling walker (2 wheeled) Gait Pattern/deviations: Step-through pattern;Decreased stride length;Ataxic;Trunk flexed Gait velocity: WFL - Encouraged patient to slow down for safety.   General Gait Details: Verbal cues for safe use of RW - keep it on the ground.  Cues to stand upright and move more slowly during gait.  Patient required repeated cues for safety.  No loss of balance noted.  Stairs             Wheelchair Mobility    Modified Rankin (Stroke Patients Only)       Balance Overall balance assessment: Needs assistance         Standing balance support: Bilateral upper extremity supported Standing balance-Leahy Scale: Poor                               Pertinent Vitals/Pain Pain Assessment: No/denies pain    Home Living Family/patient expects to be discharged to:: Private residence Living Arrangements: Spouse/significant other Available Help at Discharge: Family;Available 24 hours/day Type of Home: House Home Access: Stairs to enter Entrance Stairs-Rails: Psychiatric nurse of Steps: 4 Home Layout: Two level;Able to live on main level with bedroom/bathroom Home Equipment: Walker - 2 wheels      Prior Function Level of Independence: Independent;Needs assistance   Gait / Transfers Assistance Needed: Independent with gait.  ADL's / Homemaking Assistance Needed: Assist for meal prep and housekeeping        Hand Dominance        Extremity/Trunk Assessment   Upper Extremity Assessment: LUE deficits/detail       LUE Deficits / Details: Decreased strength in shoulder (3-/5 and "gives away" with lowering arm).  Grip strength slighly decreased   Lower Extremity Assessment: LLE deficits/detail   LLE Deficits / Details: Slight weakness of LLE (4+/5);  Noted pitting edema BLE's  Cervical / Trunk Assessment: Normal  Communication   Communication: No difficulties  Cognition Arousal/Alertness: Awake/alert Behavior During Therapy: Anxious;Impulsive Overall Cognitive Status: Within Functional Limits for tasks  assessed (Decreased safety awareness - ? baseline)                      General Comments      Exercises        Assessment/Plan    PT Assessment Patient needs continued PT services  PT Diagnosis Abnormality of gait;Generalized weakness;Hemiplegia non-dominant side (LUE weakness)   PT Problem List  Decreased strength;Decreased activity tolerance;Decreased balance;Decreased mobility;Decreased coordination;Decreased safety awareness;Cardiopulmonary status limiting activity  PT Treatment Interventions DME instruction;Gait training;Functional mobility training;Therapeutic activities;Therapeutic exercise;Balance training;Patient/family education   PT Goals (Current goals can be found in the Care Plan section) Acute Rehab PT Goals Patient Stated Goal: To figure out what is wrong with my arm. PT Goal Formulation: With patient Time For Goal Achievement: 05/03/14 Potential to Achieve Goals: Good    Frequency Min 3X/week   Barriers to discharge        Co-evaluation               End of Session Equipment Utilized During Treatment: Gait belt;Oxygen Activity Tolerance: Patient limited by fatigue Patient left: in bed;with call bell/phone within reach;with bed alarm set Nurse Communication: Mobility status         Time: 1445-1510 PT Time Calculation (min) (ACUTE ONLY): 25 min   Charges:   PT Evaluation $Initial PT Evaluation Tier I: 1 Procedure PT Treatments $Gait Training: 8-22 mins   PT G CodesDespina Pole 04/26/2014, 5:23 PM Carita Pian. Sanjuana Kava, [place] Pager [phone]

## 2014-04-26 NOTE — Progress Notes (Signed)
Echocardiogram 2D Echocardiogram has been performed.  Luke Pierce 04/26/2014, 2:13 PM

## 2014-04-26 NOTE — Progress Notes (Signed)
STROKE TEAM PROGRESS NOTE   HISTORY Luke Pierce is a 68 y.o. male with a history of COPD, hypertension, alcohol dependence, tobacco dependence and congestive heart failure, who presents to emergency department today for evaluation of left arm and leg weakness. The patient states that he started having difficulties with his left arm around Thanksgiving. He did not report this to his family because he did not want to disrupt the holidays. He states his symptoms have been gradually getting worse. Approximately 1 week after he developed left arm problems his left leg started to "act up". Apparently his leg has been intermittently weak; however, he is still able to walk; although he has to be more careful to avoid falling. He denies pain in either extremity. He feels that the weakness is becoming worse. He denies other associated symptoms except for lower extremity edema which is been going on for several weeks and increased shortness of breath. He has a cough that has been nonproductive. He takes aspirin occasionally but not on a regular basis.  Date last known well: Unable to determine Time last known well: Unable to determine tPA Given: No: Beyond the window for therapeutic treatment.   SUBJECTIVE (INTERVAL HISTORY) There are no family members present. The patient notes that he is still having weakness of his proximal left upper extremity. There does not seem to be any associated discomfort. Dr Erlinda Hong explained to the patient that this might be a cervical spine problem.   OBJECTIVE Temp:  [97.5 F (36.4 C)-98.6 F (37 C)] 98.6 F (37 C) (12/06 0839) Pulse Rate:  [50-139] 57 (12/06 0919) Cardiac Rhythm:  [-] Sinus bradycardia (12/06 0919) Resp:  [11-21] 16 (12/06 0839) BP: (91-133)/(51-75) 91/52 mmHg (12/06 0839) SpO2:  [90 %-100 %] 95 % (12/06 0919) FiO2 (%):  [36 %] 36 % (12/05 1924) Weight:  [164 lb 12.8 oz (74.753 kg)-168 lb 6.4 oz (76.386 kg)] 164 lb 12.8 oz (74.753 kg) (12/06 0829)  No  results for input(s): GLUCAP in the last 168 hours.  Recent Labs Lab 04/25/14 1408 04/26/14 0139  NA 123* 125*  K 5.2 4.6  CL 78* 84*  CO2 32 28  GLUCOSE 86 130*  BUN 67* 53*  CREATININE 1.38* 1.05  CALCIUM 8.4 8.0*    Recent Labs Lab 04/25/14 1408  AST 32  ALT 37  ALKPHOS 53  BILITOT 0.9  PROT 6.2  ALBUMIN 3.5    Recent Labs Lab 04/25/14 1408 04/26/14 0139  WBC 6.4 3.2*  NEUTROABS 5.2  --   HGB 18.1* 16.5  HCT 52.2* 47.5  MCV 90.0 89.1  PLT 150 136*    Recent Labs Lab 04/25/14 1436 04/25/14 2010 04/26/14 0139 04/26/14 0914  TROPONINI <0.30 <0.30 <0.30 <0.30    Recent Labs  04/25/14 1408  LABPROT 15.3*  INR 1.20    Recent Labs  04/25/14 1759  COLORURINE YELLOW  LABSPEC 1.016  PHURINE 5.5  GLUCOSEU NEGATIVE  HGBUR NEGATIVE  BILIRUBINUR NEGATIVE  KETONESUR 15*  PROTEINUR NEGATIVE  UROBILINOGEN 1.0  NITRITE NEGATIVE  LEUKOCYTESUR NEGATIVE       Component Value Date/Time   CHOL 94 04/26/2014 0139   TRIG 46 04/26/2014 0139   HDL 56 04/26/2014 0139   CHOLHDL 1.7 04/26/2014 0139   VLDL 9 04/26/2014 0139   LDLCALC 29 04/26/2014 0139   Lab Results  Component Value Date   HGBA1C 5.7* 04/26/2014   No results found for: LABOPIA, COCAINSCRNUR, LABBENZ, AMPHETMU, THCU, LABBARB  No results for input(s): ETH in  the last 168 hours.  Dg Chest 2 View 04/25/2014    Hazy left lower lobe airspace disease at the site of previous airspace abnormality. This may reflect atelectasis versus scarring versus pneumonia.     Ct Head Wo Contrast 04/25/2014    1. No acute intracranial pathology.  2. Mild generalized cerebral atrophy.     MRI /  Mra Head Wo Contrast 04/25/2014    Mild atrophy and small vessel disease. No acute intracranial findings.  Mild pannus without visible cervical cord compression.  Minor non stenotic irregularity of the intracranial vasculature without proximal flow limiting stenosis.     MRI C/T spine - pending  PHYSICAL  EXAM  Temp:  [97.5 F (36.4 C)-98.6 F (37 C)] 98.6 F (37 C) (12/06 1427) Pulse Rate:  [50-66] 56 (12/06 1427) Resp:  [16-20] 16 (12/06 1427) BP: (91-133)/(51-71) 96/56 mmHg (12/06 1427) SpO2:  [92 %-95 %] 92 % (12/06 1510) FiO2 (%):  [36 %] 36 % (12/05 1924) Weight:  [164 lb 12.8 oz (74.753 kg)-168 lb 6.4 oz (76.386 kg)] 164 lb 12.8 oz (74.753 kg) (12/06 0829)  General - Well nourished, well developed, in no apparent distress.  Ophthalmologic - not able to see through due to likely cataracts.  Cardiovascular - Regular rate and rhythm with no murmur.  Mental Status -  Level of arousal and orientation to time, place, and person were intact. Language including expression, naming, repetition, comprehension was assessed and found intact.  Cranial Nerves II - XII - II - Visual field intact OU. III, IV, VI - Extraocular movements intact. V - Facial sensation intact bilaterally. VII - Facial movement intact bilaterally. VIII - Hearing & vestibular intact bilaterally. X - Palate elevates symmetrically. XI - Chin turning & shoulder shrug intact bilaterally. XII - Tongue protrusion intact.  Motor Strength - The patient's strength was normal in all extremities except left bicep and deltoid 3+/5 and tricep 4/5 and pronator drift was present.  Bulk was normal and fasciculations were absent.   Motor Tone - Muscle tone was assessed at the neck and appendages and was normal.  Reflexes - The patient's reflexes were 1+ UEs and 2+ patellar and diminished ankle reflex bilaterally and he had no pathological reflexes.  Sensory - Light touch, temperature/pinprick were assessed and were grossly symmetrical.    Coordination - The patient had normal movements in the hands and feet with no ataxia or dysmetria.  Tremor was absent.  Gait and Station - deferred as pt on 2D echo exam.   ASSESSMENT/PLAN Mr. Hassaan Crite is a 68 y.o. male with history of COPD, hypertension, alcohol abuse, tobacco  dependence, and congestive heart failure, presenting with left UE weakness for 10 days. He did not receive IV t-PA secondary to late presentation.   Left UE weakness for 10 days - proximal weakness, distally reserved. No upper neuron signs. No neck pain. MRI brain negative. Need to rule out cervical radiculopathy C5-6, less likely cord compression.  Resultant - left upper extremity proximal weakness.  MRI  negative for stroke  MRA  no significant stenosis but small vessel disease.  Carotid Doppler - 1-39% ICA stenosis. Vertebral artery flow is antegrade.   2D Echo pending ( reading system is down)  LDL 29  HgbA1c 5.7  Subcutaneous heparin for VTE prophylaxis  Diet Heart with thin liquids  No antithrombotics prior to admission, now on aspirin 325 mg orally every day  Patient counseled to be compliant with his antithrombotic medications  Ongoing aggressive  stroke risk factor management  Therapy recommendations:  Pending  Disposition:  Pending  ? Left UE radiculopathy - gradual onset - proximal > distal - no facial or LE involvement - Probable cervical spine abnormality causing left upper extremity weakness. Patient will need MRI of C-spine and T-spine. Discuss with Dr.Mikhail. - may need outpt EMG/NCS - PT/OT  Hypertension  Home meds: Norvasc 10 mg daily, Lotensin 40 mg daily, and hydrochlorothiazide 25 mg daily.  Blood pressures running low at times with systolic blood pressures in the 90 range.  Other Stroke Risk Factors  Advanced age  Cigarette smoker, advised to stop smoking  ETOH use  Other Active Problems  Right lower extremity cellulitis  Leukopenia  Hyponatremia - 125  Probable dehydration  Hospital day # 1  Mikey Bussing PA-C [facility] Pager [phone] 04/26/2014, 2:10 PM  I, the attending vascular neurologist, have personally obtained a history, examined the patient, evaluated laboratory data, individually viewed  imaging studies. Together with the NP/PA, we formulated the assessment and plan of care which reflects our mutual decision.  I have made any additions or clarifications directly to the above note and agree with the findings and plan as currently documented.   Please note: All text in blue color in the note is my addition to the original note.   Rosalin Hawking, MD PhD Stroke Neurology 04/26/2014 6:07 PM   To contact Stroke Continuity provider, please refer to [email or web address] After hours, contact General Neurology

## 2014-04-26 NOTE — Progress Notes (Signed)
VASCULAR LAB PRELIMINARY  PRELIMINARY  PRELIMINARY  PRELIMINARY  Carotid Dopplers completed.    Preliminary report:  1-39% ICA stenosis.  Vertebral artery flow is antegrade.   Maree Ainley, RVT 04/26/2014, 10:00 AM

## 2014-04-27 LAB — CBC
HEMATOCRIT: 48.6 % (ref 39.0–52.0)
Hemoglobin: 16.2 g/dL (ref 13.0–17.0)
MCH: 30.5 pg (ref 26.0–34.0)
MCHC: 33.3 g/dL (ref 30.0–36.0)
MCV: 91.4 fL (ref 78.0–100.0)
PLATELETS: 131 10*3/uL — AB (ref 150–400)
RBC: 5.32 MIL/uL (ref 4.22–5.81)
RDW: 13.9 % (ref 11.5–15.5)
WBC: 6.9 10*3/uL (ref 4.0–10.5)

## 2014-04-27 LAB — BASIC METABOLIC PANEL
ANION GAP: 10 (ref 5–15)
BUN: 50 mg/dL — ABNORMAL HIGH (ref 6–23)
CHLORIDE: 84 meq/L — AB (ref 96–112)
CO2: 37 meq/L — AB (ref 19–32)
CREATININE: 1.04 mg/dL (ref 0.50–1.35)
Calcium: 8.4 mg/dL (ref 8.4–10.5)
GFR calc Af Amer: 83 mL/min — ABNORMAL LOW (ref >90)
GFR calc non Af Amer: 72 mL/min — ABNORMAL LOW (ref >90)
Glucose, Bld: 84 mg/dL (ref 70–99)
POTASSIUM: 4.2 meq/L (ref 3.7–5.3)
Sodium: 131 mEq/L — ABNORMAL LOW (ref 137–147)

## 2014-04-27 LAB — LEGIONELLA ANTIGEN, URINE

## 2014-04-27 MED ORDER — VANCOMYCIN HCL IN DEXTROSE 1-5 GM/200ML-% IV SOLN
1000.0000 mg | Freq: Two times a day (BID) | INTRAVENOUS | Status: DC
  Administered 2014-04-27 – 2014-04-28 (×2): 1000 mg via INTRAVENOUS
  Filled 2014-04-27 (×4): qty 200

## 2014-04-27 MED ORDER — FUROSEMIDE 40 MG PO TABS
40.0000 mg | ORAL_TABLET | Freq: Every day | ORAL | Status: DC
  Administered 2014-04-27 – 2014-04-28 (×2): 40 mg via ORAL
  Filled 2014-04-27 (×2): qty 1

## 2014-04-27 NOTE — Consult Note (Signed)
Reason for Consult:  Cervical spondylosis, cervical degenerative disc disease, cervical disc herniation, left upper extremity weakness Referring Physician:  Dr. Cristal Ford ([facility])  Luke Pierce is an 68 y.o. right-handed white male.  HPI: Patient admitted 2 days ago after presenting to the emergency room with complaint of shortness of breath, swelling of the distal lower extremities, and left upper extremity weakness. Patient was evaluated, and admitted to the [facility]. He's been under treatment for pneumonia with vancomycin and cefepime, which is also been treating a cellulitis in his lower extremities. I spoke with Dr. Ree Kida who notes that both the pneumonia and cellulitis are improving. Patient has been also undergoing stroke neurology evaluation with Drs. Elita Quick,.  MRI of the brain did not reveal an obvious stroke.  Patient explains that he began to notice weakness of the left upper extremity including difficulty lifting the arm at the level of the shoulder about 12 days ago. He says that it did worsen some last week. He therefore presented to the emergency room for evaluation 2 days ago.  Because the MRI of the brain did not reveal an obvious stroke, he was further evaluated with MRI scans of the cervical and thoracic spine. The cervical MRI shows significant cervical spondylosis, cervical degenerative disc disease, and spondylitic disc herniation at the C3-4, C4-5, and C5-6 levels. Worse on the left than the right at C3-4 and C4-5, and worse on the right than the left at C5-6. No alteration of cord signal is seen on the cervical MRI. The thoracic MRI shows mild thoracic spondylosis and thoracic degenerative disc disease, but no significant neural compression. Of note the thoracic MRI did show bilateral pleural effusions.  Neurosurgical consultation was requested by Dr. Ree Kida for further recommendations, although she noted that the patient when she  had spoken to him earlier today was disinclined to consider surgery.  Past Medical History:  Past Medical History  Diagnosis Date  . COPD (chronic obstructive pulmonary disease)   . Hypertension   . EtOH dependence   . Tobacco dependence   . CHF (congestive heart failure)     Past Surgical History: History reviewed. No pertinent past surgical history.  Family History: No family history on file.  Social History:  reports that he quit smoking about 8 months ago. His smoking use included Cigarettes. He has a 45 pack-year smoking history. He has never used smokeless tobacco. He reports that he drinks about 6.0 oz of alcohol per week. He reports that he does not use illicit drugs.  Allergies: No Known Allergies  Medications: I have reviewed the patient's current medications.  ROS:  Notable for those difficulties described in his history of present illness and past mental history,. He does continue on fluid restriction and O2 by nasal cannula.  Physical Examination: Patient well-developed well-nourished white male sitting up in chair, on O2 by nasal cannula, in no acute distress. Blood pressure 107/60, pulse 70, temperature 98.2 F (36.8 C), temperature source Oral, resp. rate 18, height 5' 11"  (1.803 m), weight 73.392 kg (161 lb 12.8 oz), SpO2 94 %. Lungs:  Diminished breath sounds, symmetrical respiratory excursion. Heart:  Regular rate and rhythm, normal S1 and S2, no murmur. Abdomen:  Soft, nondistended, bowel sounds present. Extremity:  Moderate clubbing, bilateral distal lower extremity edema, right worse than left. Musculoskeletal:  No tenderness over the cervical or thoracic spinous processes. Reasonable mobility of the neck.  Neurological Examination: Mental Status Examination:  Awake and alert, oriented, following  commands. Speech fluent. Cranial Nerve Examination:  Pupils equal round and reactive to light. EOMI. Facial sensation intact. Facial movements symmetrical. Hearing  present bilaterally. Palatal movements symmetrical. Shoulder shrug symmetrical. Tongue midline. Motor Examination:  Right upper extremity strength is 5/5 in the deltoid, biceps, triceps, intrinsics, and grip. In the left upper extremity the deltoid and biceps are 3-4 minus, triceps are 4 minus to 4, intrinsics are 4, grip is 5. In the lower extremities the iliopsoas is 4/5 bilaterally, the remainder the distal lower extremity strength is 5/5 including the quadriceps, dorsiflexor, and plantar flexor. Sensory Examination:  Intact to pinprick in the distal upper and lower extremities. Reflex Examination:   Biceps and brachial radialis are absent bilaterally, triceps are 2 bilaterally, left quadriceps is 2, right quadriceps is 1, gastrocnemius are absent bilaterally. Toes are downgoing bilaterally.   Results for orders placed or performed during the hospital encounter of 04/25/14 (from the past 48 hour(s))  CBC with Differential     Status: Abnormal   Collection Time: 04/25/14  2:08 PM  Result Value Ref Range   WBC 6.4 4.0 - 10.5 K/uL   RBC 5.80 4.22 - 5.81 MIL/uL   Hemoglobin 18.1 (H) 13.0 - 17.0 g/dL   HCT 52.2 (H) 39.0 - 52.0 %   MCV 90.0 78.0 - 100.0 fL   MCH 31.2 26.0 - 34.0 pg   MCHC 34.7 30.0 - 36.0 g/dL   RDW 13.7 11.5 - 15.5 %   Platelets 150 150 - 400 K/uL   Neutrophils Relative % 81 (H) 43 - 77 %   Neutro Abs 5.2 1.7 - 7.7 K/uL   Lymphocytes Relative 8 (L) 12 - 46 %   Lymphs Abs 0.5 (L) 0.7 - 4.0 K/uL   Monocytes Relative 11 3 - 12 %   Monocytes Absolute 0.7 0.1 - 1.0 K/uL   Eosinophils Relative 0 0 - 5 %   Eosinophils Absolute 0.0 0.0 - 0.7 K/uL   Basophils Relative 0 0 - 1 %   Basophils Absolute 0.0 0.0 - 0.1 K/uL  Comprehensive metabolic panel     Status: Abnormal   Collection Time: 04/25/14  2:08 PM  Result Value Ref Range   Sodium 123 (L) 137 - 147 mEq/L   Potassium 5.2 3.7 - 5.3 mEq/L   Chloride 78 (L) 96 - 112 mEq/L   CO2 32 19 - 32 mEq/L   Glucose, Bld 86 70 - 99  mg/dL   BUN 67 (H) 6 - 23 mg/dL   Creatinine, Ser 1.38 (H) 0.50 - 1.35 mg/dL   Calcium 8.4 8.4 - 10.5 mg/dL   Total Protein 6.2 6.0 - 8.3 g/dL   Albumin 3.5 3.5 - 5.2 g/dL   AST 32 0 - 37 U/L   ALT 37 0 - 53 U/L   Alkaline Phosphatase 53 39 - 117 U/L   Total Bilirubin 0.9 0.3 - 1.2 mg/dL   GFR calc non Af Amer 51 (L) >90 mL/min   GFR calc Af Amer 59 (L) >90 mL/min    Comment: (NOTE) The eGFR has been calculated using the CKD EPI equation. This calculation has not been validated in all clinical situations. eGFR's persistently <90 mL/min signify possible Chronic Kidney Disease.    Anion gap 13 5 - 15  Protime-INR     Status: Abnormal   Collection Time: 04/25/14  2:08 PM  Result Value Ref Range   Prothrombin Time 15.3 (H) 11.6 - 15.2 seconds   INR 1.20 0.00 - 1.49  I-Stat Troponin, ED (not at [facility])     Status: Abnormal   Collection Time: 04/25/14  2:24 PM  Result Value Ref Range   Troponin i, poc 0.17 (HH) 0.00 - 0.08 ng/mL   Comment NOTIFIED PHYSICIAN    Comment 3            Comment: Due to the release kinetics of cTnI, a negative result within the first hours of the onset of symptoms does not rule out myocardial infarction with certainty. If myocardial infarction is still suspected, repeat the test at appropriate intervals.   Troponin I     Status: None   Collection Time: 04/25/14  2:36 PM  Result Value Ref Range   Troponin I <0.30 <0.30 ng/mL    Comment:        Due to the release kinetics of cTnI, a negative result within the first hours of the onset of symptoms does not rule out myocardial infarction with certainty. If myocardial infarction is still suspected, repeat the test at appropriate intervals.   Osmolality     Status: None   Collection Time: 04/25/14  4:54 PM  Result Value Ref Range   Osmolality 278 275 - 300 mOsm/kg    Comment: Performed at [facility] b natriuretic peptide     Status: Abnormal   Collection Time: 04/25/14  4:54 PM  Result  Value Ref Range   Pro B Natriuretic peptide (BNP) 5975.0 (H) 0 - 125 pg/mL  Urinalysis, Routine w reflex microscopic     Status: Abnormal   Collection Time: 04/25/14  5:59 PM  Result Value Ref Range   Color, Urine YELLOW YELLOW   APPearance CLEAR CLEAR   Specific Gravity, Urine 1.016 1.005 - 1.030   pH 5.5 5.0 - 8.0   Glucose, UA NEGATIVE NEGATIVE mg/dL   Hgb urine dipstick NEGATIVE NEGATIVE   Bilirubin Urine NEGATIVE NEGATIVE   Ketones, ur 15 (A) NEGATIVE mg/dL   Protein, ur NEGATIVE NEGATIVE mg/dL   Urobilinogen, UA 1.0 0.0 - 1.0 mg/dL   Nitrite NEGATIVE NEGATIVE   Leukocytes, UA NEGATIVE NEGATIVE    Comment: MICROSCOPIC NOT DONE ON URINES WITH NEGATIVE PROTEIN, BLOOD, LEUKOCYTES, NITRITE, OR GLUCOSE <1000 mg/dL.  Sodium, urine, random     Status: None   Collection Time: 04/25/14  5:59 PM  Result Value Ref Range   Sodium, Ur <20 mEq/L  Osmolality, urine     Status: None   Collection Time: 04/25/14  5:59 PM  Result Value Ref Range   Osmolality, Ur 466 390 - 1090 mOsm/kg    Comment: Performed at [facility]  Creatinine, urine, random     Status: None   Collection Time: 04/25/14  5:59 PM  Result Value Ref Range   Creatinine, Urine 68.76 mg/dL  Culture, blood (routine x 2) Call MD if unable to obtain prior to antibiotics being given     Status: None (Preliminary result)   Collection Time: 04/25/14  8:10 PM  Result Value Ref Range   Specimen Description BLOOD RIGHT ARM    Special Requests      BOTTLES DRAWN AEROBIC AND ANAEROBIC 10CC BLUE, 8CC RED   Culture  Setup Time      04/26/2014 03:55 Performed at [place] NO GROWTH TO DATE CULTURE WILL BE HELD FOR 5 DAYS BEFORE ISSUING A FINAL NEGATIVE REPORT Performed at [facility]    Report Status PENDING   HIV antibody     Status: None   Collection Time: 04/25/14  8:10 PM  Result Value Ref Range   HIV 1&2 Ab, 4th Generation NONREACTIVE NONREACTIVE     Comment: (NOTE) A NONREACTIVE HIV Ag/Ab result does not exclude HIV infection since the time frame for seroconversion is variable. If acute HIV infection is suspected, a HIV-1 RNA Qualitative TMA test is recommended. HIV-1/2 Antibody Diff         Not indicated. HIV-1 RNA, Qual TMA           Not indicated. PLEASE NOTE: This information has been disclosed to you from records whose confidentiality may be protected by state law. If your state requires such protection, then the state law prohibits you from making any further disclosure of the information without the specific written consent of the person to whom it pertains, or as otherwise permitted by law. A general authorization for the release of medical or other information is NOT sufficient for this purpose. The performance of this assay has not been clinically validated in patients less than 67 years old. Performed at [facility]   Troponin I (q 6hr x 3)     Status: None   Collection Time: 04/25/14  8:10 PM  Result Value Ref Range   Troponin I <0.30 <0.30 ng/mL    Comment:        Due to the release kinetics of cTnI, a negative result within the first hours of the onset of symptoms does not rule out myocardial infarction with certainty. If myocardial infarction is still suspected, repeat the test at appropriate intervals.   Culture, blood (routine x 2) Call MD if unable to obtain prior to antibiotics being given     Status: None (Preliminary result)   Collection Time: 04/25/14  8:18 PM  Result Value Ref Range   Specimen Description BLOOD LEFT HAND    Special Requests BOTTLES DRAWN AEROBIC AND ANAEROBIC [place]    Culture  Setup Time      04/26/2014 03:55 Performed at [place] NO GROWTH TO DATE CULTURE WILL BE HELD FOR 5 DAYS BEFORE ISSUING A FINAL NEGATIVE REPORT Performed at [facility]    Report Status PENDING   Strep pneumoniae urinary antigen      Status: None   Collection Time: 04/25/14  8:36 PM  Result Value Ref Range   Strep Pneumo Urinary Antigen NEGATIVE NEGATIVE    Comment:        Infection due to S. pneumoniae cannot be absolutely ruled out since the antigen present may be below the detection limit of the test.   Hemoglobin A1c     Status: Abnormal   Collection Time: 04/26/14  1:39 AM  Result Value Ref Range   Hgb A1c MFr Bld 5.7 (H) <5.7 %    Comment: (NOTE)                                                                       According to the ADA Clinical Practice Recommendations for 2011, when HbA1c is used as  a screening test:  >=6.5%   Diagnostic of Diabetes Mellitus           (if abnormal result is confirmed) 5.7-6.4%   Increased risk of developing Diabetes Mellitus References:Diagnosis and Classification of Diabetes Mellitus,Diabetes TRRN,1657,90(XYBFX 1):S62-S69 and Standards of Medical Care in         Diabetes - 2011,Diabetes OVAN,1916,60 (Suppl 1):S11-S61.    Mean Plasma Glucose 117 (H) <117 mg/dL    Comment: Performed at [facility] metabolic panel     Status: Abnormal   Collection Time: 04/26/14  1:39 AM  Result Value Ref Range   Sodium 125 (L) 137 - 147 mEq/L   Potassium 4.6 3.7 - 5.3 mEq/L   Chloride 84 (L) 96 - 112 mEq/L   CO2 28 19 - 32 mEq/L   Glucose, Bld 130 (H) 70 - 99 mg/dL   BUN 53 (H) 6 - 23 mg/dL   Creatinine, Ser 1.05 0.50 - 1.35 mg/dL   Calcium 8.0 (L) 8.4 - 10.5 mg/dL   GFR calc non Af Amer 71 (L) >90 mL/min   GFR calc Af Amer 82 (L) >90 mL/min    Comment: (NOTE) The eGFR has been calculated using the CKD EPI equation. This calculation has not been validated in all clinical situations. eGFR's persistently <90 mL/min signify possible Chronic Kidney Disease.    Anion gap 13 5 - 15  CBC     Status: Abnormal   Collection Time: 04/26/14  1:39 AM  Result Value Ref Range   WBC 3.2 (L) 4.0 - 10.5 K/uL   RBC 5.33 4.22 - 5.81 MIL/uL   Hemoglobin 16.5 13.0 - 17.0  g/dL   HCT 47.5 39.0 - 52.0 %   MCV 89.1 78.0 - 100.0 fL   MCH 31.0 26.0 - 34.0 pg   MCHC 34.7 30.0 - 36.0 g/dL   RDW 13.6 11.5 - 15.5 %   Platelets 136 (L) 150 - 400 K/uL  Troponin I (q 6hr x 3)     Status: None   Collection Time: 04/26/14  1:39 AM  Result Value Ref Range   Troponin I <0.30 <0.30 ng/mL    Comment:        Due to the release kinetics of cTnI, a negative result within the first hours of the onset of symptoms does not rule out myocardial infarction with certainty. If myocardial infarction is still suspected, repeat the test at appropriate intervals.   Lipid panel     Status: None   Collection Time: 04/26/14  1:39 AM  Result Value Ref Range   Cholesterol 94 0 - 200 mg/dL   Triglycerides 46 <150 mg/dL   HDL 56 >39 mg/dL   Total CHOL/HDL Ratio 1.7 RATIO   VLDL 9 0 - 40 mg/dL   LDL Cholesterol 29 0 - 99 mg/dL    Comment:        Total Cholesterol/HDL:CHD Risk Coronary Heart Disease Risk Table                     Men   Women  1/2 Average Risk   3.4   3.3  Average Risk       5.0   4.4  2 X Average Risk   9.6   7.1  3 X Average Risk  23.4   11.0        Use the calculated Patient Ratio above and the CHD Risk Table to determine the patient's CHD Risk.  ATP III CLASSIFICATION (LDL):  <100     mg/dL   Optimal  100-129  mg/dL   Near or Above                    Optimal  130-159  mg/dL   Borderline  160-189  mg/dL   High  >190     mg/dL   Very High   Troponin I (q 6hr x 3)     Status: None   Collection Time: 04/26/14  9:14 AM  Result Value Ref Range   Troponin I <0.30 <0.30 ng/mL    Comment:        Due to the release kinetics of cTnI, a negative result within the first hours of the onset of symptoms does not rule out myocardial infarction with certainty. If myocardial infarction is still suspected, repeat the test at appropriate intervals.   Basic metabolic panel     Status: Abnormal   Collection Time: 04/27/14  6:27 AM  Result Value Ref Range    Sodium 131 (L) 137 - 147 mEq/L   Potassium 4.2 3.7 - 5.3 mEq/L   Chloride 84 (L) 96 - 112 mEq/L   CO2 37 (H) 19 - 32 mEq/L   Glucose, Bld 84 70 - 99 mg/dL   BUN 50 (H) 6 - 23 mg/dL   Creatinine, Ser 1.04 0.50 - 1.35 mg/dL   Calcium 8.4 8.4 - 10.5 mg/dL   GFR calc non Af Amer 72 (L) >90 mL/min   GFR calc Af Amer 83 (L) >90 mL/min    Comment: (NOTE) The eGFR has been calculated using the CKD EPI equation. This calculation has not been validated in all clinical situations. eGFR's persistently <90 mL/min signify possible Chronic Kidney Disease.    Anion gap 10 5 - 15  CBC     Status: Abnormal   Collection Time: 04/27/14  6:27 AM  Result Value Ref Range   WBC 6.9 4.0 - 10.5 K/uL   RBC 5.32 4.22 - 5.81 MIL/uL   Hemoglobin 16.2 13.0 - 17.0 g/dL   HCT 48.6 39.0 - 52.0 %   MCV 91.4 78.0 - 100.0 fL   MCH 30.5 26.0 - 34.0 pg   MCHC 33.3 30.0 - 36.0 g/dL   RDW 13.9 11.5 - 15.5 %   Platelets 131 (L) 150 - 400 K/uL    Dg Chest 2 View  04/25/2014   CLINICAL DATA:  Shortness of breath, history of CHF and COPD. Progressive weakness. Bilateral leg swelling.  EXAM: CHEST  2 VIEW  COMPARISON:  08/17/2013  FINDINGS: The lungs are hyperinflated likely secondary to COPD. There is hazy left lower lobe airspace disease. There is no pleural effusion or pneumothorax. There is mild bilateral diffuse interstitial thickening which is likely chronic. The heart and mediastinal contours are unremarkable.  The osseous structures are unremarkable.  IMPRESSION: Hazy left lower lobe airspace disease at the site of previous airspace abnormality. This may reflect atelectasis versus scarring versus pneumonia.   Electronically Signed   By: Kathreen Devoid   On: 04/25/2014 15:30   Ct Head Wo Contrast  04/25/2014   CLINICAL DATA:  Left arm weakness, denies fall or trauma, large hematoma over the left forearm.  EXAM: CT HEAD WITHOUT CONTRAST  TECHNIQUE: Contiguous axial images were obtained from the base of the skull through  the vertex without intravenous contrast.  COMPARISON:  None.  FINDINGS: There is no evidence of mass effect, midline shift, or extra-axial fluid collections.  There is no evidence of a space-occupying lesion or intracranial hemorrhage. There is no evidence of a cortical-based area of acute infarction. There is generalized cerebral atrophy.  The ventricles and sulci are appropriate for the patient's age. The basal cisterns are patent.  Visualized portions of the orbits are unremarkable. The visualized portions of the paranasal sinuses and mastoid air cells are unremarkable. Cerebrovascular atherosclerotic calcifications are noted.  The osseous structures are unremarkable.  IMPRESSION: 1. No acute intracranial pathology. 2. Mild generalized cerebral atrophy.   Electronically Signed   By: Kathreen Devoid   On: 04/25/2014 15:54   Mr Jodene Nam Head Wo Contrast  04/25/2014   CLINICAL DATA:  Two week history of LEFT upper and lower extremity weakness. Stroke risk factors include hypertension and smoking.  EXAM: MRI HEAD WITHOUT CONTRAST  MRA HEAD WITHOUT CONTRAST  TECHNIQUE: Multiplanar, multiecho pulse sequences of the brain and surrounding structures were obtained without intravenous contrast. Angiographic images of the head were obtained using MRA technique without contrast.  COMPARISON:  CT head earlier today.  FINDINGS: MRI HEAD FINDINGS  No evidence for acute infarction, hemorrhage, mass lesion, hydrocephalus, or extra-axial fluid. Mild cerebral and cerebellar atrophy. Mild subcortical and periventricular T2 and FLAIR hyperintensities, likely chronic microvascular ischemic change. Normal pituitary and cerebellar tonsils. Mild pannus surrounds the odontoid. No areas of chronic hemorrhage. Extracranial soft tissues appear unremarkable.  MRA HEAD FINDINGS  The internal carotid arteries are mildly irregular in cavernous and supraclinoid segments but there is no flow-limiting stenosis. The basilar artery is widely patent with  both vertebrals contributing. Fetal origin LEFT PCA. No proximal intracranial flow-limiting stenosis or aneurysm. Mild irregularity both superior cerebellar artery vessels proximally.  IMPRESSION: Mild atrophy and small vessel disease. No acute intracranial findings.  Mild pannus without visible cervical cord compression.  Minor non stenotic irregularity of the intracranial vasculature without proximal flow limiting stenosis.   Electronically Signed   By: Rolla Flatten M.D.   On: 04/25/2014 19:58   Mr Brain Wo Contrast  04/25/2014   CLINICAL DATA:  Two week history of LEFT upper and lower extremity weakness. Stroke risk factors include hypertension and smoking.  EXAM: MRI HEAD WITHOUT CONTRAST  MRA HEAD WITHOUT CONTRAST  TECHNIQUE: Multiplanar, multiecho pulse sequences of the brain and surrounding structures were obtained without intravenous contrast. Angiographic images of the head were obtained using MRA technique without contrast.  COMPARISON:  CT head earlier today.  FINDINGS: MRI HEAD FINDINGS  No evidence for acute infarction, hemorrhage, mass lesion, hydrocephalus, or extra-axial fluid. Mild cerebral and cerebellar atrophy. Mild subcortical and periventricular T2 and FLAIR hyperintensities, likely chronic microvascular ischemic change. Normal pituitary and cerebellar tonsils. Mild pannus surrounds the odontoid. No areas of chronic hemorrhage. Extracranial soft tissues appear unremarkable.  MRA HEAD FINDINGS  The internal carotid arteries are mildly irregular in cavernous and supraclinoid segments but there is no flow-limiting stenosis. The basilar artery is widely patent with both vertebrals contributing. Fetal origin LEFT PCA. No proximal intracranial flow-limiting stenosis or aneurysm. Mild irregularity both superior cerebellar artery vessels proximally.  IMPRESSION: Mild atrophy and small vessel disease. No acute intracranial findings.  Mild pannus without visible cervical cord compression.  Minor non  stenotic irregularity of the intracranial vasculature without proximal flow limiting stenosis.   Electronically Signed   By: Rolla Flatten M.D.   On: 04/25/2014 19:58   Mr Cervical Spine W Wo Contrast  04/26/2014   CLINICAL DATA:  Left upper and lower extremity weakness over the course of  8-9 days. Progressive shortness of breath and leg swelling.  EXAM: MRI CERVICAL AND LUMBAR SPINE WITHOUT AND WITH CONTRAST  TECHNIQUE: Multiplanar and multiecho pulse sequences of the cervical spine and thoracic spine were obtained without and with intravenous contrast.  CONTRAST:  66m MULTIHANCE GADOBENATE DIMEGLUMINE 529 MG/ML IV SOLN  COMPARISON:  MRI brain 04/25/2014  FINDINGS: MRI CERVICAL SPINE FINDINGS  Normal signal is present in the cervical spinal cord. The craniocervical junction is within normal limits. The visualized intracranial contents are normal. Flow is present in the vertebral arteries.  C2-3: Mild left-sided facet hypertrophy is present. There is no significant stenosis.  C3-4: A broad-based disc osteophyte complex partially effaces the ventral CSF. Uncovertebral and facet spurring results in moderate foraminal stenosis bilaterally, left greater than right.  C4-5: A broad-based disc osteophyte complex effaces the ventral CSF and distorts the ventral surface the cord. The canal is narrowed to 9 mm. Severe left and moderate right foraminal stenosis is secondary to uncovertebral spurring.  C5-6: A rightward disc osteophyte complex effaces the ventral CSF on the right with some distortion of the right side of the cord but no significant cord signal abnormality. Moderate to severe right foraminal stenosis secondary to uncovertebral spurring. The left foramen is patent.  C6-7: A broad-based disc osteophyte complex is present. There is minimal effacement of the ventral CSF. Moderate foraminal narrowing is evident bilaterally due to uncovertebral disease, worse on the right.  C7-T1: Mild uncovertebral spurring is  worse on the right with mild right foraminal stenosis.  The postcontrast images demonstrate no pathologic enhancement.  MRI THORACIC SPINE FINDINGS  Normal signal is present throughout the thoracic spinal cord the conus medullaris which terminates at L1, within normal limits. Marrow signal, vertebral body heights, and alignment are normal. Schmorl's nodes are present at multiple levels in the lower thoracic spine beginning at T8-9. There is no significant kyphosis.  T1-2: Mild left foraminal narrowing is due to facet hypertrophy. The central canal is patent.  T2-3:  Negative.  T3-4:  Negative.  T4-5:  Negative.  T5-6: A shallow central disc protrusion is present without significant stenosis.  T6-7: A shallow central disc protrusion is present. There is no significant stenosis.  T7-8: A central disc protrusion slightly distorts the ventral surface cord without significant foraminal narrowing.  T8-9: A shallow central disc protrusion is present without significant stenosis.  T9-10: A leftward disc protrusion is present. There is no significant stenosis.  T10-11: Mild facet hypertrophy is present without significant stenosis.  T11-12: Negative.  IMPRESSION: 1. 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 2. Moderate foraminal stenosis bilaterally at C3-4 is worse on the left. 3. Moderate central canal narrowing at C4-5 and C5-6. 4. Mild left-sided facet hypertrophy at C2-3. 5. Moderate to severe right foraminal stenosis at C5-6. 6. Moderate foraminal narrowing bilaterally at C6-7. 7. Disc protrusion at T7-8 slightly distorts the ventral surface of cord without abnormal signal. 8. Shallow disc protrusions otherwise T5-6 through T9-10 without other significant stenoses.   Electronically Signed   By: CLawrence SantiagoM.D.   On: 04/26/2014 18:12   Mr Thoracic Spine W Wo Contrast  04/26/2014   CLINICAL DATA:  Left upper and lower extremity weakness over the course of 8-9 days. Progressive shortness of breath  and leg swelling.  EXAM: MRI CERVICAL AND LUMBAR SPINE WITHOUT AND WITH CONTRAST  TECHNIQUE: Multiplanar and multiecho pulse sequences of the cervical spine and thoracic spine were obtained without and with intravenous contrast.  CONTRAST:  72m MULTIHANCE GADOBENATE DIMEGLUMINE 529 MG/ML IV SOLN  COMPARISON:  MRI brain 04/25/2014  FINDINGS: MRI CERVICAL SPINE FINDINGS  Normal signal is present in the cervical spinal cord. The craniocervical junction is within normal limits. The visualized intracranial contents are normal. Flow is present in the vertebral arteries.  C2-3: Mild left-sided facet hypertrophy is present. There is no significant stenosis.  C3-4: A broad-based disc osteophyte complex partially effaces the ventral CSF. Uncovertebral and facet spurring results in moderate foraminal stenosis bilaterally, left greater than right.  C4-5: A broad-based disc osteophyte complex effaces the ventral CSF and distorts the ventral surface the cord. The canal is narrowed to 9 mm. Severe left and moderate right foraminal stenosis is secondary to uncovertebral spurring.  C5-6: A rightward disc osteophyte complex effaces the ventral CSF on the right with some distortion of the right side of the cord but no significant cord signal abnormality. Moderate to severe right foraminal stenosis secondary to uncovertebral spurring. The left foramen is patent.  C6-7: A broad-based disc osteophyte complex is present. There is minimal effacement of the ventral CSF. Moderate foraminal narrowing is evident bilaterally due to uncovertebral disease, worse on the right.  C7-T1: Mild uncovertebral spurring is worse on the right with mild right foraminal stenosis.  The postcontrast images demonstrate no pathologic enhancement.  MRI THORACIC SPINE FINDINGS  Normal signal is present throughout the thoracic spinal cord the conus medullaris which terminates at L1, within normal limits. Marrow signal, vertebral body heights, and alignment are  normal. Schmorl's nodes are present at multiple levels in the lower thoracic spine beginning at T8-9. There is no significant kyphosis.  T1-2: Mild left foraminal narrowing is due to facet hypertrophy. The central canal is patent.  T2-3:  Negative.  T3-4:  Negative.  T4-5:  Negative.  T5-6: A shallow central disc protrusion is present without significant stenosis.  T6-7: A shallow central disc protrusion is present. There is no significant stenosis.  T7-8: A central disc protrusion slightly distorts the ventral surface cord without significant foraminal narrowing.  T8-9: A shallow central disc protrusion is present without significant stenosis.  T9-10: A leftward disc protrusion is present. There is no significant stenosis.  T10-11: Mild facet hypertrophy is present without significant stenosis.  T11-12: Negative.  IMPRESSION: 1. 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 2. Moderate foraminal stenosis bilaterally at C3-4 is worse on the left. 3. Moderate central canal narrowing at C4-5 and C5-6. 4. Mild left-sided facet hypertrophy at C2-3. 5. Moderate to severe right foraminal stenosis at C5-6. 6. Moderate foraminal narrowing bilaterally at C6-7. 7. Disc protrusion at T7-8 slightly distorts the ventral surface of cord without abnormal signal. 8. Shallow disc protrusions otherwise T5-6 through T9-10 without other significant stenoses.   Electronically Signed   By: CLawrence SantiagoM.D.   On: 04/26/2014 18:12     Assessment/Plan: Spoke with the patient, his wife, and his brother at length in his hospital room; overall consultation extended over 75 minutes, of which over half the time was in face-to-face consultation with patient and his family. I also consulted with Dr. PAntony Contras from the stroke neurology service, and [facility], regarding his history, neurologic findings, and radiologic findings.  In the end it is suspected that most likely the  left upper extremity weakness is due to the multilevel cervical spondylosis, degenerative disc disease, and spondylitic disc herniation. The absence of neck and/or radicular pain although less common can be seen with  cervical radiculopathy. We do not see radiologic or examination findings suggestive of myelopathy. Stroke cannot be absolutely ruled out, although the MRI did not reveal evidence of a CVA.  I explained to the patient's family that surgical intervention can be considered for decompression and stabilization, specifically a 3 level CIII-4, C4-5, and C5-6 anterior cervical decompression and arthrodesis with structural allograft and cervical plating. I discussed the nature of his condition and the nature the surgical procedure at length with them. We discussed tip of the surgery, the need for postoperative immobilization in a hard cervical collar to be socially followed by a soft cervical collar. We discussed risks of surgery including risks of infection, bleeding, possibly for transfusion, the risk of spinal cord dysfunction with paralysis of all 4 limbs and quadriplegia, the risk of increased nerve root dysfunction with pain, weakness, numbness, or tingling. We discussed risks of esophageal and/or laryngeal dysfunction including difficulty swallowing or hoarseness of the voice. And most significantly we discussed risks of anesthetic complications including myocardial infarction, stroke, pneumonia, and death. Certainly the patient does have somewhat increased risk due to his underlying COPD, currently being treated pneumonia, bilateral pleural effusions, etc.  After discussing this very thorough with the patient's family, the patient indicated that he he does not feel that he wants to proceed with surgery and instead would rather treat this with physical therapy. He did ask whether he might be able to reconsider surgery a month from now, but I emphasized to him that the longer the neural compression is  left in place, the less likely chance there is for motor recovery. I've recommended to the patient that he consider our discussion over the coming day, discussed the situation with his family, and I will return to see him more morning, for final discussions, but certainly his initial decision not to proceed with surgery is reasonable considering the uncertainty of clinical improvement and the risks that are associated with surgery. His questions and his family's questions were answered for them.  Hosie Spangle, MD 04/27/2014, 1:09 PM

## 2014-04-27 NOTE — Plan of Care (Signed)
Problem: Progression Outcomes Goal: Rehab Team goals identified Outcome: Not Applicable Date Met:  57/90/38 Goal: Progressive activity as tolerated Outcome: Completed/Met Date Met:  04/27/14 Goal: Bowel & Bladder Continence Outcome: Completed/Met Date Met:  04/27/14 Goal: Educational plan initiated Outcome: Completed/Met Date Met:  04/27/14 Goal: Initial discharge plan initiated Outcome: Progressing Goal: Other Progression Outcomes Outcome: Completed/Met Date Met:  04/27/14

## 2014-04-27 NOTE — Plan of Care (Signed)
Problem: Progression Outcomes Goal: Progressive activity as tolerated Outcome: Progressing

## 2014-04-27 NOTE — Plan of Care (Signed)
Problem: Progression Outcomes Goal: Bowel & Bladder Continence Outcome: Progressing

## 2014-04-27 NOTE — Progress Notes (Signed)
Talked to patient with spouse present about DCP/ [facility], patient does not want any HHC at this time; CM informed patient that if he changed his mind after discharge home and wants [facility] to call his PCP and [phone]

## 2014-04-27 NOTE — Progress Notes (Addendum)
ANTIBIOTIC CONSULT NOTE - FOLLOW UP  Pharmacy Consult:  Vancomycin / Cefepime Indication: LE cellulitis + Possible PNA  No Known Allergies  Patient Measurements: Height: 5\' 11"  (180.3 cm) Weight: 161 lb 12.8 oz (73.392 kg) IBW/kg (Calculated) : 75.3  Vital Signs: Temp: 98.2 F (36.8 C) (12/07 0923) Temp Source: Oral (12/07 0923) BP: 107/60 mmHg (12/07 0923) Pulse Rate: 70 (12/07 0923) Intake/Output from previous day: 12/06 0701 - 12/07 0700 In: 1240 [P.O.:1240] Out: 2750 [Urine:2750]  Labs:  Recent Labs  04/25/14 1408 04/25/14 1759 04/26/14 0139 04/27/14 0627  WBC 6.4  --  3.2* 6.9  HGB 18.1*  --  16.5 16.2  PLT 150  --  136* 131*  LABCREA  --  68.76  --   --   CREATININE 1.38*  --  1.05 1.04   Estimated Creatinine Clearance: 70.6 mL/min (by C-G formula based on Cr of 1.04). No results for input(s): VANCOTROUGH, VANCOPEAK, VANCORANDOM, GENTTROUGH, GENTPEAK, GENTRANDOM, TOBRATROUGH, TOBRAPEAK, TOBRARND, AMIKACINPEAK, AMIKACINTROU, AMIKACIN in the last 72 hours.   Microbiology: Recent Results (from the past 720 hour(s))  Culture, blood (routine x 2) Call MD if unable to obtain prior to antibiotics being given     Status: None (Preliminary result)   Collection Time: 04/25/14  8:10 PM  Result Value Ref Range Status   Specimen Description BLOOD RIGHT ARM  Final   Special Requests   Final    BOTTLES DRAWN AEROBIC AND ANAEROBIC 10CC BLUE, 8CC RED   Culture  Setup Time   Final    04/26/2014 03:55 Performed at [facility]    Culture   Final           BLOOD CULTURE RECEIVED NO GROWTH TO DATE CULTURE WILL BE HELD FOR 5 DAYS BEFORE ISSUING A FINAL NEGATIVE REPORT Performed at [facility]    Report Status PENDING  Incomplete  Culture, blood (routine x 2) Call MD if unable to obtain prior to antibiotics being given     Status: None (Preliminary result)   Collection Time: 04/25/14  8:18 PM  Result Value Ref Range Status   Specimen Description BLOOD LEFT  HAND  Final   Special Requests BOTTLES DRAWN AEROBIC AND ANAEROBIC [place]  Final   Culture  Setup Time   Final    04/26/2014 03:55 Performed at [facility]    Culture   Final           BLOOD CULTURE RECEIVED NO GROWTH TO DATE CULTURE WILL BE HELD FOR 5 DAYS BEFORE ISSUING A FINAL NEGATIVE REPORT Performed at [facility]    Report Status PENDING  Incomplete      Assessment: 35 YOM presented to the ED with SOB and left leg weakness.  Patient to continue vancomycin and cefepime for rule out PNA and LE cellulitis.  Patient's renal function is now stable.   CTX 12/5 x1 Azith 12/5 x1 Cefepime 12/5 >> Vanc 12/5 >>  12/5 BCx - NGTD   Goal of Therapy:  Vancomycin trough level 15-20 mcg/ml   Plan:  - Change vanc to 1gm IV Q12H - Continue Cefepime 1g IV Q8H - Monitor renal fxn, clinical progress, vanc trough at [facility] - Consider starting thiamine, folate and multivitamin for EtOH use    Tiffany Talarico D. Mina Marble, PharmD, BCPS Pager:  [phone] 04/27/2014, 2:10 PM

## 2014-04-27 NOTE — Progress Notes (Signed)
[facility]                                                                              Patient Demographics  Luke Pierce, is a 68 y.o. male, DOB - 14-Jan-1946, QQP:[id number]  Admit date - 04/25/2014   Admitting Physician No admitting provider for patient encounter.  Outpatient Primary MD for the patient is [name],[name], MD  LOS - 2   Chief Complaint  Patient presents with  . Shortness of Breath      HPI on 04/25/2014 by Dr. Flonnie Overman Dhungel 68 year old male with history of COPD, active tobacco use, hypertension presented to the ED with progressive weakness of his left upper extremity followed by his left lower extremity, progressive shortness of breath and leg swellings. Patient reports being fine 2 weeks back when he started having progressive dyspnea on exertion. This this was associated with nonproductive cough and increasing bilateral leg swellings which she has been noticing for the past 6 -8 weeks. On the day of Thanksgiving (9 days back) he developed some numbness and weakness of his left arm but since he had family in town he did not want to come to the hospital. The weakness was off and on but persistent and progressed with significant weakness on his right upper arm being unable to lift it up above the horizontal. He also started noticing weakness of his left leg for past 2-3 days. For the past 2 days he also dyspnea on minimal exertion and mild orthopnea. Patient denies headache, dizziness, fever, chills, nausea , vomiting, chest pain, palpitations, SOB, abdominal pain, bowel or urinary symptoms. Denies any sick contacts, recent illness or recent travel. He reports being compliant with his medications but not with his diet. He continues to smoke.  Assessment & Plan   Left upper ext weakness -Patient only has continued weakness in the LUE -CT head: No acute intracranial pathology -MRI brain: Mild atrophy and small vessel disease, no acute intracranial findings -Pending  echocardiogram -Carotid Doppler: 1-39% ICA stenosis, vertebral artery flow is antegrade -Lipid panel: TC 94, TG 46, HDL 56, LDL 29 -Hemoglobin A1c 5.7 -PT and OT consulted- recommended home health -Neurology consultation appreciated and recommended cervical and thoracic MRI -Continue aspirin (of note, patient states that he was not compliant with his daily aspirin regimen.) -Cervical and Thoracic MRI: 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 -Neurosurgery consulted and appreciated  Lower extremity swelling -Patient did have elevated BNP on admission 5975 -He does complain of progressive lower extremity swelling over the past month -Pending echocardiogram to evaluate for heart failure -Continue to monitor daily weights, intake and output -Will place patient on 40 mg by mouth Lasix -Patient had 4150 urine output over the past 24 hours  Community-acquired pneumonia -CXR: Left lower lobe airspace disease, atelectasis versus chronic versus pneumonia -Patient currently on vancomycin and cefepime, avoiding quinolones due to prolonged QTC -Strep pneumonia antigen negative, Legionella urine antigen pending -Blood cultures negative to date  Right lower extremity cellulitis -Appears to be improving, continue vancomycin  Hyponatremia -Likely secondary to volume overload as patient does appear to be hypovolemic -Continue diuresis with strict fluid restriction -Na131  Acute kidney injury -Resolved  Hypertension -  Allowing for permissive hypertension, ACE inhibitor, amlodipine and HCTZ currently held -Patient on Lasix as well as bisoprolol  COPD with mild exacerbation -Continue duo nebs as necessary -Continue antitussives, prednisone, supplemental oxygen, Dulera -Complicated by continued smoking  Tobacco Abuse -Smoking cessation counseling given -Continue nicotine patch  Elevated I-Stat Troponin -Troponins have been negative, patient denies any chest pain at this  time  Polycythemia -Patient does have history of smoking and COPD -Continue to monitor CBC  Code Status: Full   Family Communication: None at bedside, wife via phone  Disposition Plan: Admitted  Time Spent in minutes   30 minutes  Procedures  -Carotid Doppler: 1-39% ICA stenosis, vertebral artery flow is antegrade  Consults   Neurology Neurosurgery  DVT Prophylaxis  Heparin  Lab Results  Component Value Date   PLT 131* 04/27/2014    Medications  Scheduled Meds: . aspirin  325 mg Oral Daily  . bisoprolol  20 mg Oral Daily  . ceFEPime (MAXIPIME) IV  1 g Intravenous 3 times per day  . furosemide  40 mg Intravenous Q12H  . guaiFENesin  600 mg Oral BID  . heparin  5,000 Units Subcutaneous 3 times per day  . ipratropium-albuterol  3 mL Nebulization TID  . mometasone-formoterol  2 puff Inhalation BID  . nicotine  14 mg Transdermal Daily  . potassium chloride  20 mEq Oral BID  . predniSONE  50 mg Oral Q breakfast  . sodium chloride  3 mL Intravenous Q12H  . vancomycin  1,250 mg Intravenous Q24H   Continuous Infusions:  PRN Meds:.sodium chloride, acetaminophen, albuterol, ondansetron (ZOFRAN) IV, senna-docusate, sodium chloride  Antibiotics    Anti-infectives    Start     Dose/Rate Route Frequency Ordered Stop   04/25/14 2200  ceFEPIme (MAXIPIME) 1 g in dextrose 5 % 50 mL IVPB     1 g100 mL/hr over 30 Minutes Intravenous 3 times per day 04/25/14 1745     04/25/14 1800  vancomycin (VANCOCIN) 1,250 mg in sodium chloride 0.9 % 250 mL IVPB     1,250 mg166.7 mL/hr over 90 Minutes Intravenous Every 24 hours 04/25/14 1735     04/25/14 1800  levofloxacin (LEVAQUIN) IVPB 750 mg  Status:  Discontinued     750 mg100 mL/hr over 90 Minutes Intravenous Every 24 hours 04/25/14 1735 04/25/14 1744   04/25/14 1545  cefTRIAXone (ROCEPHIN) 1 g in dextrose 5 % 50 mL IVPB     1 g100 mL/hr over 30 Minutes Intravenous  Once 04/25/14 1537 04/25/14 1812   04/25/14 1545  azithromycin  (ZITHROMAX) 500 mg in dextrose 5 % 250 mL IVPB     500 mg250 mL/hr over 60 Minutes Intravenous  Once 04/25/14 1537 04/25/14 1742        Subjective:   [name] seen and examined today.  Patient continues to complain of left upper extremity weakness. He states his left lower extremity feels stronger. Patient denies any chest pain or shortness of breath at this time.  Patient states she would like to go home.  Objective:   Filed Vitals:   04/27/14 0500 04/27/14 0540 04/27/14 0850 04/27/14 0923  BP:  109/62  107/60  Pulse:  50  70  Temp:  98 F (36.7 C)  98.2 F (36.8 C)  TempSrc:  Oral  Oral  Resp:  18  18  Height:      Weight: 73.392 kg (161 lb 12.8 oz)     SpO2:  94% 96% 94%  Wt Readings from Last 3 Encounters:  04/27/14 73.392 kg (161 lb 12.8 oz)  02/25/14 73.029 kg (161 lb)  02/24/14 72.576 kg (160 lb)     Intake/Output Summary (Last 24 hours) at 04/27/14 1201 Last data filed at 04/27/14 0545  Gross per 24 hour  Intake    500 ml  Output   2500 ml  Net  -2000 ml    Exam  General: Well developed, well nourished, NAD, appears stated age  HEENT: NCAT, mucous membranes moist.   Cardiovascular: S1 S2 auscultated, no rubs, murmurs or gallops. Regular rate and rhythm.  Respiratory: Diminished breath sounds, fine crackles  Abdomen: Soft, nontender, nondistended, + bowel sounds  Extremities: warm dry without cyanosis clubbing, trace lower extremity edema  Neuro: AAOx3, no focal deficits, Strength 4/5 LUE, 5/5 in RLE/RUE/LLE  Psych: Normal affect and demeanor with intact judgement and insight   Data Review   Micro Results Recent Results (from the past 240 hour(s))  Culture, blood (routine x 2) Call MD if unable to obtain prior to antibiotics being given     Status: None (Preliminary result)   Collection Time: 04/25/14  8:10 PM  Result Value Ref Range Status   Specimen Description BLOOD RIGHT ARM  Final   Special Requests   Final    BOTTLES DRAWN  AEROBIC AND ANAEROBIC 10CC BLUE, 8CC RED   Culture  Setup Time   Final    04/26/2014 03:55 Performed at [facility]    Culture   Final           BLOOD CULTURE RECEIVED NO GROWTH TO DATE CULTURE WILL BE HELD FOR 5 DAYS BEFORE ISSUING A FINAL NEGATIVE REPORT Performed at [facility]    Report Status PENDING  Incomplete  Culture, blood (routine x 2) Call MD if unable to obtain prior to antibiotics being given     Status: None (Preliminary result)   Collection Time: 04/25/14  8:18 PM  Result Value Ref Range Status   Specimen Description BLOOD LEFT HAND  Final   Special Requests BOTTLES DRAWN AEROBIC AND ANAEROBIC [facility] EACH  Final   Culture  Setup Time   Final    04/26/2014 03:55 Performed at [facility]    Culture   Final           BLOOD CULTURE RECEIVED NO GROWTH TO DATE CULTURE WILL BE HELD FOR 5 DAYS BEFORE ISSUING A FINAL NEGATIVE REPORT Performed at [facility]    Report Status PENDING  Incomplete    Radiology Reports Dg Chest 2 View  04/25/2014   CLINICAL DATA:  Shortness of breath, history of CHF and COPD. Progressive weakness. Bilateral leg swelling.  EXAM: CHEST  2 VIEW  COMPARISON:  08/17/2013  FINDINGS: The lungs are hyperinflated likely secondary to COPD. There is hazy left lower lobe airspace disease. There is no pleural effusion or pneumothorax. There is mild bilateral diffuse interstitial thickening which is likely chronic. The heart and mediastinal contours are unremarkable.  The osseous structures are unremarkable.  IMPRESSION: Hazy left lower lobe airspace disease at the site of previous airspace abnormality. This may reflect atelectasis versus scarring versus pneumonia.   Electronically Signed   By: Kathreen Devoid   On: 04/25/2014 15:30   Ct Head Wo Contrast  04/25/2014   CLINICAL DATA:  Left arm weakness, denies fall or trauma, large hematoma over the left forearm.  EXAM: CT HEAD WITHOUT CONTRAST  TECHNIQUE: Contiguous axial images were  obtained from  the base of the skull through the vertex without intravenous contrast.  COMPARISON:  None.  FINDINGS: There is no evidence of mass effect, midline shift, or extra-axial fluid collections. There is no evidence of a space-occupying lesion or intracranial hemorrhage. There is no evidence of a cortical-based area of acute infarction. There is generalized cerebral atrophy.  The ventricles and sulci are appropriate for the patient's age. The basal cisterns are patent.  Visualized portions of the orbits are unremarkable. The visualized portions of the paranasal sinuses and mastoid air cells are unremarkable. Cerebrovascular atherosclerotic calcifications are noted.  The osseous structures are unremarkable.  IMPRESSION: 1. No acute intracranial pathology. 2. Mild generalized cerebral atrophy.   Electronically Signed   By: Kathreen Devoid   On: 04/25/2014 15:54   Mr Jodene Nam Head Wo Contrast  04/25/2014   CLINICAL DATA:  Two week history of LEFT upper and lower extremity weakness. Stroke risk factors include hypertension and smoking.  EXAM: MRI HEAD WITHOUT CONTRAST  MRA HEAD WITHOUT CONTRAST  TECHNIQUE: Multiplanar, multiecho pulse sequences of the brain and surrounding structures were obtained without intravenous contrast. Angiographic images of the head were obtained using MRA technique without contrast.  COMPARISON:  CT head earlier today.  FINDINGS: MRI HEAD FINDINGS  No evidence for acute infarction, hemorrhage, mass lesion, hydrocephalus, or extra-axial fluid. Mild cerebral and cerebellar atrophy. Mild subcortical and periventricular T2 and FLAIR hyperintensities, likely chronic microvascular ischemic change. Normal pituitary and cerebellar tonsils. Mild pannus surrounds the odontoid. No areas of chronic hemorrhage. Extracranial soft tissues appear unremarkable.  MRA HEAD FINDINGS  The internal carotid arteries are mildly irregular in cavernous and supraclinoid segments but there is no flow-limiting stenosis.  The basilar artery is widely patent with both vertebrals contributing. Fetal origin LEFT PCA. No proximal intracranial flow-limiting stenosis or aneurysm. Mild irregularity both superior cerebellar artery vessels proximally.  IMPRESSION: Mild atrophy and small vessel disease. No acute intracranial findings.  Mild pannus without visible cervical cord compression.  Minor non stenotic irregularity of the intracranial vasculature without proximal flow limiting stenosis.   Electronically Signed   By: Rolla Flatten M.D.   On: 04/25/2014 19:58   Mr Brain Wo Contrast  04/25/2014   CLINICAL DATA:  Two week history of LEFT upper and lower extremity weakness. Stroke risk factors include hypertension and smoking.  EXAM: MRI HEAD WITHOUT CONTRAST  MRA HEAD WITHOUT CONTRAST  TECHNIQUE: Multiplanar, multiecho pulse sequences of the brain and surrounding structures were obtained without intravenous contrast. Angiographic images of the head were obtained using MRA technique without contrast.  COMPARISON:  CT head earlier today.  FINDINGS: MRI HEAD FINDINGS  No evidence for acute infarction, hemorrhage, mass lesion, hydrocephalus, or extra-axial fluid. Mild cerebral and cerebellar atrophy. Mild subcortical and periventricular T2 and FLAIR hyperintensities, likely chronic microvascular ischemic change. Normal pituitary and cerebellar tonsils. Mild pannus surrounds the odontoid. No areas of chronic hemorrhage. Extracranial soft tissues appear unremarkable.  MRA HEAD FINDINGS  The internal carotid arteries are mildly irregular in cavernous and supraclinoid segments but there is no flow-limiting stenosis. The basilar artery is widely patent with both vertebrals contributing. Fetal origin LEFT PCA. No proximal intracranial flow-limiting stenosis or aneurysm. Mild irregularity both superior cerebellar artery vessels proximally.  IMPRESSION: Mild atrophy and small vessel disease. No acute intracranial findings.  Mild pannus without  visible cervical cord compression.  Minor non stenotic irregularity of the intracranial vasculature without proximal flow limiting stenosis.   Electronically Signed   By: Michae Kava.D.  On: 04/25/2014 19:58   Mr Cervical Spine W Wo Contrast  04/26/2014   CLINICAL DATA:  Left upper and lower extremity weakness over the course of 8-9 days. Progressive shortness of breath and leg swelling.  EXAM: MRI CERVICAL AND LUMBAR SPINE WITHOUT AND WITH CONTRAST  TECHNIQUE: Multiplanar and multiecho pulse sequences of the cervical spine and thoracic spine were obtained without and with intravenous contrast.  CONTRAST:  32mL MULTIHANCE GADOBENATE DIMEGLUMINE 529 MG/ML IV SOLN  COMPARISON:  MRI brain 04/25/2014  FINDINGS: MRI CERVICAL SPINE FINDINGS  Normal signal is present in the cervical spinal cord. The craniocervical junction is within normal limits. The visualized intracranial contents are normal. Flow is present in the vertebral arteries.  C2-3: Mild left-sided facet hypertrophy is present. There is no significant stenosis.  C3-4: A broad-based disc osteophyte complex partially effaces the ventral CSF. Uncovertebral and facet spurring results in moderate foraminal stenosis bilaterally, left greater than right.  C4-5: A broad-based disc osteophyte complex effaces the ventral CSF and distorts the ventral surface the cord. The canal is narrowed to 9 mm. Severe left and moderate right foraminal stenosis is secondary to uncovertebral spurring.  C5-6: A rightward disc osteophyte complex effaces the ventral CSF on the right with some distortion of the right side of the cord but no significant cord signal abnormality. Moderate to severe right foraminal stenosis secondary to uncovertebral spurring. The left foramen is patent.  C6-7: A broad-based disc osteophyte complex is present. There is minimal effacement of the ventral CSF. Moderate foraminal narrowing is evident bilaterally due to uncovertebral disease, worse on the  right.  C7-T1: Mild uncovertebral spurring is worse on the right with mild right foraminal stenosis.  The postcontrast images demonstrate no pathologic enhancement.  MRI THORACIC SPINE FINDINGS  Normal signal is present throughout the thoracic spinal cord the conus medullaris which terminates at L1, within normal limits. Marrow signal, vertebral body heights, and alignment are normal. Schmorl's nodes are present at multiple levels in the lower thoracic spine beginning at T8-9. There is no significant kyphosis.  T1-2: Mild left foraminal narrowing is due to facet hypertrophy. The central canal is patent.  T2-3:  Negative.  T3-4:  Negative.  T4-5:  Negative.  T5-6: A shallow central disc protrusion is present without significant stenosis.  T6-7: A shallow central disc protrusion is present. There is no significant stenosis.  T7-8: A central disc protrusion slightly distorts the ventral surface cord without significant foraminal narrowing.  T8-9: A shallow central disc protrusion is present without significant stenosis.  T9-10: A leftward disc protrusion is present. There is no significant stenosis.  T10-11: Mild facet hypertrophy is present without significant stenosis.  T11-12: Negative.  IMPRESSION: 1. 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 2. Moderate foraminal stenosis bilaterally at C3-4 is worse on the left. 3. Moderate central canal narrowing at C4-5 and C5-6. 4. Mild left-sided facet hypertrophy at C2-3. 5. Moderate to severe right foraminal stenosis at C5-6. 6. Moderate foraminal narrowing bilaterally at C6-7. 7. Disc protrusion at T7-8 slightly distorts the ventral surface of cord without abnormal signal. 8. Shallow disc protrusions otherwise T5-6 through T9-10 without other significant stenoses.   Electronically Signed   By: Lawrence Santiago M.D.   On: 04/26/2014 18:12   Mr Thoracic Spine W Wo Contrast  04/26/2014   CLINICAL DATA:  Left upper and lower extremity weakness over the course  of 8-9 days. Progressive shortness of breath and leg swelling.  EXAM: MRI CERVICAL AND LUMBAR  SPINE WITHOUT AND WITH CONTRAST  TECHNIQUE: Multiplanar and multiecho pulse sequences of the cervical spine and thoracic spine were obtained without and with intravenous contrast.  CONTRAST:  79mL MULTIHANCE GADOBENATE DIMEGLUMINE 529 MG/ML IV SOLN  COMPARISON:  MRI brain 04/25/2014  FINDINGS: MRI CERVICAL SPINE FINDINGS  Normal signal is present in the cervical spinal cord. The craniocervical junction is within normal limits. The visualized intracranial contents are normal. Flow is present in the vertebral arteries.  C2-3: Mild left-sided facet hypertrophy is present. There is no significant stenosis.  C3-4: A broad-based disc osteophyte complex partially effaces the ventral CSF. Uncovertebral and facet spurring results in moderate foraminal stenosis bilaterally, left greater than right.  C4-5: A broad-based disc osteophyte complex effaces the ventral CSF and distorts the ventral surface the cord. The canal is narrowed to 9 mm. Severe left and moderate right foraminal stenosis is secondary to uncovertebral spurring.  C5-6: A rightward disc osteophyte complex effaces the ventral CSF on the right with some distortion of the right side of the cord but no significant cord signal abnormality. Moderate to severe right foraminal stenosis secondary to uncovertebral spurring. The left foramen is patent.  C6-7: A broad-based disc osteophyte complex is present. There is minimal effacement of the ventral CSF. Moderate foraminal narrowing is evident bilaterally due to uncovertebral disease, worse on the right.  C7-T1: Mild uncovertebral spurring is worse on the right with mild right foraminal stenosis.  The postcontrast images demonstrate no pathologic enhancement.  MRI THORACIC SPINE FINDINGS  Normal signal is present throughout the thoracic spinal cord the conus medullaris which terminates at L1, within normal limits. Marrow signal,  vertebral body heights, and alignment are normal. Schmorl's nodes are present at multiple levels in the lower thoracic spine beginning at T8-9. There is no significant kyphosis.  T1-2: Mild left foraminal narrowing is due to facet hypertrophy. The central canal is patent.  T2-3:  Negative.  T3-4:  Negative.  T4-5:  Negative.  T5-6: A shallow central disc protrusion is present without significant stenosis.  T6-7: A shallow central disc protrusion is present. There is no significant stenosis.  T7-8: A central disc protrusion slightly distorts the ventral surface cord without significant foraminal narrowing.  T8-9: A shallow central disc protrusion is present without significant stenosis.  T9-10: A leftward disc protrusion is present. There is no significant stenosis.  T10-11: Mild facet hypertrophy is present without significant stenosis.  T11-12: Negative.  IMPRESSION: 1. 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 2. Moderate foraminal stenosis bilaterally at C3-4 is worse on the left. 3. Moderate central canal narrowing at C4-5 and C5-6. 4. Mild left-sided facet hypertrophy at C2-3. 5. Moderate to severe right foraminal stenosis at C5-6. 6. Moderate foraminal narrowing bilaterally at C6-7. 7. Disc protrusion at T7-8 slightly distorts the ventral surface of cord without abnormal signal. 8. Shallow disc protrusions otherwise T5-6 through T9-10 without other significant stenoses.   Electronically Signed   By: Lawrence Santiago M.D.   On: 04/26/2014 18:12    CBC  Recent Labs Lab 04/25/14 1408 04/26/14 0139 04/27/14 0627  WBC 6.4 3.2* 6.9  HGB 18.1* 16.5 16.2  HCT 52.2* 47.5 48.6  PLT 150 136* 131*  MCV 90.0 89.1 91.4  MCH 31.2 31.0 30.5  MCHC 34.7 34.7 33.3  RDW 13.7 13.6 13.9  LYMPHSABS 0.5*  --   --   MONOABS 0.7  --   --   EOSABS 0.0  --   --   BASOSABS 0.0  --   --  Chemistries   Recent Labs Lab 04/25/14 1408 04/26/14 0139 04/27/14 0627  NA 123* 125* 131*  K 5.2 4.6  4.2  CL 78* 84* 84*  CO2 32 28 37*  GLUCOSE 86 130* 84  BUN 67* 53* 50*  CREATININE 1.38* 1.05 1.04  CALCIUM 8.4 8.0* 8.4  AST 32  --   --   ALT 37  --   --   ALKPHOS 53  --   --   BILITOT 0.9  --   --    ------------------------------------------------------------------------------------------------------------------ estimated creatinine clearance is 70.6 mL/min (by C-G formula based on Cr of 1.04). ------------------------------------------------------------------------------------------------------------------  Recent Labs  04/26/14 0139  HGBA1C 5.7*   ------------------------------------------------------------------------------------------------------------------  Recent Labs  04/26/14 0139  CHOL 94  HDL 56  LDLCALC 29  TRIG 46  CHOLHDL 1.7   ------------------------------------------------------------------------------------------------------------------ No results for input(s): TSH, T4TOTAL, T3FREE, THYROIDAB in the last 72 hours.  Invalid input(s): FREET3 ------------------------------------------------------------------------------------------------------------------ No results for input(s): VITAMINB12, FOLATE, FERRITIN, TIBC, IRON, RETICCTPCT in the last 72 hours.  Coagulation profile  Recent Labs Lab 04/25/14 1408  INR 1.20    No results for input(s): DDIMER in the last 72 hours.  Cardiac Enzymes  Recent Labs Lab 04/25/14 2010 04/26/14 0139 04/26/14 0914  TROPONINI <0.30 <0.30 <0.30   ------------------------------------------------------------------------------------------------------------------ Invalid input(s): POCBNP    Artin Mceuen D.O. on 04/27/2014 at 12:01 PM  Between 7am to 7pm - Pager - [phone]  After 7pm go to [email or web address] - password [email or web address]  And look for the night coverage person covering for me after hours  [facility] Office  [phone]

## 2014-04-27 NOTE — Plan of Care (Signed)
Problem: Progression Outcomes Goal: Pain controlled Outcome: Not Applicable Date Met:  06/14/91

## 2014-04-27 NOTE — Progress Notes (Signed)
Physical Therapy Treatment Patient Details Name: Luke Pierce MRN: [id number] DOB: 1945/09/12 Today's Date: 04/27/2014    History of Present Illness Patient is a 68 yo male admitted 04/25/14 with Lt-sided weakness (UE prior to LE), BLE edema, and SOB.  PMH:  COPD, CHF, HTN, ETOH and tobacco abuse, cellulitis RLE.    PT Comments    Pt reports very little walking as he still has foley catheter. Remains slightly unsteady when walking without a device (however could be attributed to lack of recent activity). No unsteadiness with RW and pt agrees to use although ultimately wants to progress to no device. Ambulated on room air, however pulse oximeter unable to get an accurate reading (fingers cold; telemetry indicates HR 61, pulse ox showing pulse 128 with SaO2 76%). May need home O2 if an accurate measurement can be obtained. RN informed.    Follow Up Recommendations  Home health PT;Supervision/Assistance - 24 hour     Equipment Recommendations  None recommended by PT    Recommendations for Other Services       Precautions / Restrictions Precautions Precautions: Fall Restrictions Weight Bearing Restrictions: No    Mobility  Bed Mobility Overal bed mobility: Modified Independent Bed Mobility: Supine to Sit     Supine to sit: Modified independent (Device/Increase time)     General bed mobility comments: Waited for lines to be set-up and was mindful of lines while moving  Transfers Overall transfer level: Needs assistance Equipment used: Rolling walker (2 wheeled) Transfers: Sit to/from Stand Sit to Stand: Min guard         General transfer comment: vc for safe use of RW; slight imbalance as came to stand first trial with independent recovery; repeated trials OK  Ambulation/Gait Ambulation/Gait assistance: Min assist;Min guard Ambulation Distance (Feet): 100 Feet (seated rest, 100) Assistive device: Rolling walker (2 wheeled);None Gait Pattern/deviations: Step-through  pattern;Decreased stride length;Drifts right/left;Staggering left Gait velocity: WFL   General Gait Details: ambulated with RW with no LOB or drifting Lt/Rt; ambulated without RW with drifting and one stagger to his Lt (able to recover without assist); pt feels imbalance is due to lack of activity and encouraged him to request nursing assist to ambulate   Stairs            Wheelchair Mobility    Modified Rankin (Stroke Patients Only)       Balance Overall balance assessment: Needs assistance         Standing balance support: No upper extremity supported Standing balance-Leahy Scale: Good Standing balance comment: stood statically x 2 minutes and then performed balance assessment without LOB     Tandem Stance - Right Leg: 30   Rhomberg - Eyes Opened: 30 Rhomberg - Eyes Closed: 15 (greater ant/post sway than normal)   High Level Balance Comments: pt able to reach forward >10 inches with hesistancy, but no unsteadiness; able to fully weight shift Rt/Lt and look behind himself to each side    Cognition Arousal/Alertness: Awake/alert Behavior During Therapy: Flat affect Overall Cognitive Status: Within Functional Limits for tasks assessed (good awareness of his balance deficits)                      Exercises      General Comments        Pertinent Vitals/Pain See comments above re: ? decr SaO2 on room air  Pain Assessment: No/denies pain    Home Living  Prior Function            PT Goals (current goals can now be found in the care plan section) Acute Rehab PT Goals Patient Stated Goal: To figure out what is wrong with my arm. Progress towards PT goals: Progressing toward goals    Frequency  Min 3X/week    PT Plan Current plan remains appropriate    Co-evaluation             End of Session Equipment Utilized During Treatment: Gait belt Activity Tolerance: Patient limited by fatigue Patient left: with  call bell/phone within reach;in chair;with chair alarm set;with nursing/sitter in room     Time: 0923-1001 PT Time Calculation (min) (ACUTE ONLY): 38 min  Charges:  $Gait Training: 38-52 mins                    G Codes:      Artesha Wemhoff May 13, 2014, 10:15 AM Pager [phone]

## 2014-04-27 NOTE — Evaluation (Signed)
Occupational Therapy Evaluation Patient Details Name: Luke Pierce MRN: [id number] DOB: [birth year] Today's Date: 04/27/2014    History of Present Illness Patient is a 68 yo male admitted 04/25/14 with Lt-sided weakness (UE prior to LE), BLE edema, and SOB.  PMH:  COPD, CHF, HTN, ETOH and tobacco abuse, cellulitis RLE.   Clinical Impression   Pt reports he was independent in self care prior to admission.  He had not showering in about a month as he became more weak.  Pt presents with balance impairment and L UE weakness proximally.  Educated pt to dress L UE first then R and in availability of tub equipment vs use of 3 in 1 to enhance safety. Pt to purchase tub seat on his own.  Will follow acutely.    Follow Up Recommendations  Home health OT    Equipment Recommendations       Recommendations for Other Services       Precautions / Restrictions Precautions Precautions: Fall Restrictions Weight Bearing Restrictions: No      Mobility Bed Mobility   General bed mobility comments: pt received in chair  Transfers Overall transfer level: Needs assistance Equipment used: Rolling walker (2 wheeled) Transfers: Sit to/from Stand Sit to Stand: Min guard         General transfer comment: verbal cues for safety and to slow pace, initially imbalanced    Balance          ADL Overall ADL's : Needs assistance/impaired Eating/Feeding: Independent   Grooming: Wash/dry hands;Standing;Supervision/safety   Upper Body Bathing: Sitting;Minimal assitance   Lower Body Bathing: Min guard;Sit to/from stand   Upper Body Dressing : Minimal assistance;Sitting   Lower Body Dressing: Sit to/from stand;Minimal assistance   Toilet Transfer: Min guard;Ambulation;RW;Comfort height toilet   Toileting- Clothing Manipulation and Hygiene: Sit to/from stand;Min guard       Functional mobility during ADLs: Min guard;Rolling walker General ADL Comments: Pt with questions about tub  equipment.  Educated pt and wife in available options.  Pt will purchase on his own.  Pt instructed to dress L UE first and undress it last.  Some unsteadiness in standing requiring min guard assist for balance, but no physical assist.     Vision                     Perception     Praxis      Pertinent Vitals/Pain Pain Assessment: No/denies pain     Hand Dominance Right   Extremity/Trunk Assessment Upper Extremity Assessment Upper Extremity Assessment: LUE deficits/detail LUE Deficits / Details: 3-/5 shoulder strength, full PROM all areas without pain or crepitus, decreased control when lowering arm LUE Coordination: decreased gross motor   Lower Extremity Assessment Lower Extremity Assessment: Defer to PT evaluation   Cervical / Trunk Assessment Cervical / Trunk Assessment: Normal   Communication Communication Communication: No difficulties   Cognition Arousal/Alertness: Awake/alert Behavior During Therapy: Agitated;Impulsive Overall Cognitive Status: Within Functional Limits for tasks assessed                     General Comments       Exercises       Shoulder Instructions      Home Living Family/patient expects to be discharged to:: Private residence Living Arrangements: Spouse/significant other Available Help at Discharge: Family;Available 24 hours/day Type of Home: House Home Access: Stairs to enter [employer] of Steps: 4 Entrance Stairs-Rails: Right;Left Home Layout: Two level;Able to live on main  level with bedroom/bathroom     Bathroom Shower/Tub: Teacher, early years/pre: Standard     Home Equipment: Walker - 2 wheels          Prior Functioning/Environment Level of Independence: Independent;Needs assistance  Gait / Transfers Assistance Needed: Independent with gait. ADL's / Homemaking Assistance Needed: Assist for meal prep and housekeeping        OT Diagnosis: Generalized weakness   OT Problem  List: Impaired balance (sitting and/or standing);Impaired UE functional use;Decreased knowledge of use of DME or AE;Decreased safety awareness;Decreased coordination;Decreased strength   OT Treatment/Interventions: Self-care/ADL training;DME and/or AE instruction;Therapeutic activities;Patient/family education;Balance training    OT Goals(Current goals can be found in the care plan section) Acute Rehab OT Goals Patient Stated Goal: To figure out what is wrong with my arm. OT Goal Formulation: With patient Time For Goal Achievement: 05/04/14 Potential to Achieve Goals: Good ADL Goals Pt Will Perform Upper Body Bathing: with modified independence;sitting Pt Will Perform Upper Body Dressing: with modified independence;sitting Pt Will Perform Lower Body Dressing: with modified independence;sit to/from stand Pt Will Transfer to Toilet: with modified independence;ambulating Pt Will Perform Tub/Shower Transfer: Tub transfer;with supervision;grab bars;rolling walker;shower seat;ambulating  OT Frequency: Min 2X/week   Barriers to D/C:            Co-evaluation              End of Session Equipment Utilized During Treatment: Gait belt;Rolling walker  Activity Tolerance: Patient tolerated treatment well Patient left: in chair;with call bell/phone within reach;with family/visitor present (MD)   Time: 6063-0160 OT Time Calculation (min): 40 min Charges:  OT General Charges $OT Visit: 1 Procedure OT Evaluation $Initial OT Evaluation Tier I: 1 Procedure OT Treatments $Self Care/Home Management : 23-37 mins G-Codes:    Malka So 04/27/2014, 11:50 AM  [phone]

## 2014-04-27 NOTE — Plan of Care (Signed)
Problem: Progression Outcomes Goal: Communication method established Outcome: Completed/Met Date Met:  04/27/14     

## 2014-04-27 NOTE — Progress Notes (Signed)
INITIAL NUTRITION ASSESSMENT  DOCUMENTATION CODES Per approved criteria  -Not Applicable   INTERVENTION: Encourage PO intake Pt declined any intervention at this time  NUTRITION DIAGNOSIS: Inadequate oral intake related to poor appetite as evidenced by pt's report of eating 50% less than usual.   Goal: Pt to meet >/= 90% of their estimated nutrition needs   Monitor:  PO intake, weight trend, labs  Reason for Assessment: Malnutrition Screening Tool, score of 3  68 y.o. male  Admitting Dx: CVA (cerebral vascular accident)  ASSESSMENT: 68 year old male with history of COPD, active tobacco use, hypertension presented to the ED with progressive weakness of his left upper extremity followed by his left lower extremity, progressive shortness of breath and leg swellings.   The cervical MRI shows significant cervical spondylosis, cervical degenerative disc disease, and spondylitic disc herniation at the C3-4, C4-5, and C5-6 levels.  Patient reports that for the past 3-4 weeks he has had a decreased appetite and has been eating about 50% of meals. He is unsure how much weight he has lost as he doesn't keep track of his weight; he remembers weighing 180 lbs a while ago. Per review of weight history, pt weighed 183 lbs about 2 years ago and weight has been stable for the past 2 months. Patient reports eating 100% of his lunch today; per nursing notes he at 100% of breakfast and dinner yesterday. RD encouraged patient to eat more frequently throughout the day if he is only able to tolerate 50% of meals. Discussed ways to increase calories and protein if weight loss continues. Patient states that his wife is a good cook and makes a meat with veggies every night; he feels he will eat better when he goes home and he declines any interventions at this time.   Labs: low sodium, low calcium, low chloride   Height: Ht Readings from Last 1 Encounters:  04/25/14 5\' 11"  (1.803 m)    Weight: Wt  Readings from Last 1 Encounters:  04/27/14 161 lb 12.8 oz (73.392 kg)    Ideal Body Weight: 172 lbs  % Ideal Body Weight: 94%  Wt Readings from Last 10 Encounters:  04/27/14 161 lb 12.8 oz (73.392 kg)  02/25/14 161 lb (73.029 kg)  02/24/14 160 lb (72.576 kg)  02/23/14 161 lb (73.029 kg)  09/08/13 165 lb (74.844 kg)  08/17/13 169 lb (76.658 kg)  08/04/13 173 lb (78.472 kg)  06/04/13 172 lb (78.019 kg)  05/27/12 183 lb (83.008 kg)    Usual Body Weight: 180 lbs (2 years ago)  % Usual Body Weight: 89%  BMI:  Body mass index is 22.58 kg/(m^2).  Estimated Nutritional Needs: Kcal: 1800-2000 Protein: 100-110 grams Fluid: 1 L restriction per MD  Skin: +1 RLE and LLE edema  Diet Order: Diet Heart  EDUCATION NEEDS: -No education needs identified at this time   Intake/Output Summary (Last 24 hours) at 04/27/14 1439 Last data filed at 04/27/14 0545  Gross per 24 hour  Intake    500 ml  Output   1800 ml  Net  -1300 ml    Last BM: 12/5   Labs:   Recent Labs Lab 04/25/14 1408 04/26/14 0139 04/27/14 0627  NA 123* 125* 131*  K 5.2 4.6 4.2  CL 78* 84* 84*  CO2 32 28 37*  BUN 67* 53* 50*  CREATININE 1.38* 1.05 1.04  CALCIUM 8.4 8.0* 8.4  GLUCOSE 86 130* 84    CBG (last 3)  No results for input(s): GLUCAP  in the last 72 hours.  Scheduled Meds: . aspirin  325 mg Oral Daily  . bisoprolol  20 mg Oral Daily  . ceFEPime (MAXIPIME) IV  1 g Intravenous 3 times per day  . furosemide  40 mg Oral Daily  . guaiFENesin  600 mg Oral BID  . heparin  5,000 Units Subcutaneous 3 times per day  . ipratropium-albuterol  3 mL Nebulization TID  . mometasone-formoterol  2 puff Inhalation BID  . nicotine  14 mg Transdermal Daily  . potassium chloride  20 mEq Oral BID  . predniSONE  50 mg Oral Q breakfast  . sodium chloride  3 mL Intravenous Q12H  . vancomycin  1,000 mg Intravenous Q12H    Continuous Infusions:   Past Medical History  Diagnosis Date  . COPD (chronic  obstructive pulmonary disease)   . Hypertension   . EtOH dependence   . Tobacco dependence   . CHF (congestive heart failure)     History reviewed. No pertinent past surgical history.  Luke Pierce RD, LDN Inpatient Clinical Dietitian Pager: [phone] After Hours Pager: [phone]

## 2014-04-27 NOTE — Plan of Care (Signed)
Problem: Progression Outcomes Goal: Tolerating diet/TF at goal rate Outcome: Completed/Met Date Met:  04/27/14     

## 2014-04-27 NOTE — Progress Notes (Signed)
STROKE TEAM PROGRESS NOTE   HISTORY Luke Pierce is a 68 y.o. male with a history of COPD, hypertension, alcohol dependence, tobacco dependence and congestive heart failure, who presents to emergency department today for evaluation of left arm and leg weakness. The patient states that he started having difficulties with his left arm around Thanksgiving. He did not report this to his family because he did not want to disrupt the holidays. He states his symptoms have been gradually getting worse. Approximately 1 week after he developed left arm problems his left leg started to "act up". Apparently his leg has been intermittently weak; however, he is still able to walk; although he has to be more careful to avoid falling. He denies pain in either extremity. He feels that the weakness is becoming worse. He denies other associated symptoms except for lower extremity edema which is been going on for several weeks and increased shortness of breath. He has a cough that has been nonproductive. He takes aspirin occasionally but not on a regular basis.  Date last known well: Unable to determine Time last known well: Unable to determine tPA Given: No: Beyond the window for therapeutic treatment.   SUBJECTIVE (INTERVAL HISTORY) There are no family members present. The patient notes that he is still having weakness of his proximal left upper extremity. There does not seem to be any associated discomfort. Dr Erlinda Hong explained to the patient that this might be a cervical spine problem.   OBJECTIVE Temp:  [97.6 F (36.4 C)-98.2 F (36.8 C)] 98.1 F (36.7 C) (12/07 1451) Pulse Rate:  [50-70] 53 (12/07 1451) Cardiac Rhythm:  [-] Sinus bradycardia (12/06 2000) Resp:  [16-20] 18 (12/07 1451) BP: (85-109)/(40-62) 85/40 mmHg (12/07 1451) SpO2:  [90 %-97 %] 97 % (12/07 1451) Weight:  [161 lb 12.8 oz (73.392 kg)] 161 lb 12.8 oz (73.392 kg) (12/07 0500)  No results for input(s): GLUCAP in the last 168 hours.  Recent  Labs Lab 04/25/14 1408 04/26/14 0139 04/27/14 0627  NA 123* 125* 131*  K 5.2 4.6 4.2  CL 78* 84* 84*  CO2 32 28 37*  GLUCOSE 86 130* 84  BUN 67* 53* 50*  CREATININE 1.38* 1.05 1.04  CALCIUM 8.4 8.0* 8.4    Recent Labs Lab 04/25/14 1408  AST 32  ALT 37  ALKPHOS 53  BILITOT 0.9  PROT 6.2  ALBUMIN 3.5    Recent Labs Lab 04/25/14 1408 04/26/14 0139 04/27/14 0627  WBC 6.4 3.2* 6.9  NEUTROABS 5.2  --   --   HGB 18.1* 16.5 16.2  HCT 52.2* 47.5 48.6  MCV 90.0 89.1 91.4  PLT 150 136* 131*    Recent Labs Lab 04/25/14 1436 04/25/14 2010 04/26/14 0139 04/26/14 0914  TROPONINI <0.30 <0.30 <0.30 <0.30    Recent Labs  04/25/14 1408  LABPROT 15.3*  INR 1.20    Recent Labs  04/25/14 1759  COLORURINE YELLOW  LABSPEC 1.016  PHURINE 5.5  GLUCOSEU NEGATIVE  HGBUR NEGATIVE  BILIRUBINUR NEGATIVE  KETONESUR 15*  PROTEINUR NEGATIVE  UROBILINOGEN 1.0  NITRITE NEGATIVE  LEUKOCYTESUR NEGATIVE       Component Value Date/Time   CHOL 94 04/26/2014 0139   TRIG 46 04/26/2014 0139   HDL 56 04/26/2014 0139   CHOLHDL 1.7 04/26/2014 0139   VLDL 9 04/26/2014 0139   LDLCALC 29 04/26/2014 0139   Lab Results  Component Value Date   HGBA1C 5.7* 04/26/2014   No results found for: LABOPIA, COCAINSCRNUR, LABBENZ, AMPHETMU, THCU, LABBARB  No results for input(s): ETH in the last 168 hours.  Dg Chest 2 View 04/25/2014    Hazy left lower lobe airspace disease at the site of previous airspace abnormality. This may reflect atelectasis versus scarring versus pneumonia.     Ct Head Wo Contrast 04/25/2014    1. No acute intracranial pathology.  2. Mild generalized cerebral atrophy.     MRI /  Mra Head Wo Contrast 04/25/2014    Mild atrophy and small vessel disease. No acute intracranial findings.  Mild pannus without visible cervical cord compression.  Minor non stenotic irregularity of the intracranial vasculature without proximal flow limiting stenosis.     MRI C/T spine  - pending  PHYSICAL EXAM  Temp:  [97.6 F (36.4 C)-98.2 F (36.8 C)] 98.1 F (36.7 C) (12/07 1451) Pulse Rate:  [50-70] 53 (12/07 1451) Resp:  [16-20] 18 (12/07 1451) BP: (85-109)/(40-62) 85/40 mmHg (12/07 1451) SpO2:  [90 %-97 %] 97 % (12/07 1451) Weight:  [161 lb 12.8 oz (73.392 kg)] 161 lb 12.8 oz (73.392 kg) (12/07 0500)  General - Well nourished, well developed middle aged Caucasian male, in no apparent distress.  Ophthalmologic - not able to see through due to likely cataracts.  Cardiovascular - Regular rate and rhythm with no murmur.  Mental Status -  Level of arousal and orientation to time, place, and person were intact. Language including expression, naming, repetition, comprehension was assessed and found intact.  Cranial Nerves II - XII - II - Visual field intact OU. III, IV, VI - Extraocular movements intact. V - Facial sensation intact bilaterally. VII - Facial movement intact bilaterally. VIII - Hearing & vestibular intact bilaterally. X - Palate elevates symmetrically. XI - Chin turning & shoulder shrug intact bilaterally. XII - Tongue protrusion intact.  Motor Strength - The patient's strength was normal in all extremities except left bicep and deltoid 3+/5 and tricep 4/5 and pronator drift was present.  Bulk was normal and fasciculations were absent.  Symmetric distal muscle strength in both hands. Motor Tone - Muscle tone was assessed at the neck and appendages and was normal.  Reflexes - The patient's reflexes were symmetrical except depressed bicep jerks bilaterally and exaggerated triceps and supinator jerks left greater than right. and he had no pathological reflexes.  Sensory - Light touch, temperature/pinprick were assessed and were grossly symmetrical.    Coordination - The patient had normal movements in the hands and feet with no ataxia or dysmetria.  Tremor was absent.  Gait and Station - cautious but steady. Romberg sign  negative ASSESSMENT/PLAN Mr. Tacari Repass is a 68 y.o. male with history of COPD, hypertension, alcohol abuse, tobacco dependence, and congestive heart failure, presenting with left UE weakness for 10 days. He did not receive IV t-PA secondary to late presentation.   Left UE weakness for 10 days - proximal weakness,   No upper neuron signs. No neck pain. 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  MRI  negative for stroke  MRA  no significant stenosis but small vessel disease.  Carotid Doppler - 1-39% ICA stenosis. Vertebral artery flow is antegrade.   2D Echo pending ( reading system is down)  LDL 29  HgbA1c 5.7  Subcutaneous heparin for VTE prophylaxis  Diet Heart with thin liquids  No antithrombotics prior to admission, now on aspirin 325 mg orally every day  Patient  counseled to be compliant with his antithrombotic medications  Ongoing aggressive stroke risk factor management  Therapy recommendations:  Pending  Disposition:  Pending  ? Left UE radiculopathy - gradual onset - proximal > distal - no facial or LE involvement - Probable cervical spine abnormality causing left upper extremity weakness. MRI C spine shows degeneartive changes and compression at C 4-5-  Which explains neurological findings of depressed biceps jerks and mild hyperreflexia D/W Neurosurgery Dr Sherwood Gambler patient has unrelaible history and surgery may be useful to prevent further worsening but no necessarily to regain the strength that he has lost in his left shoulder and arm. Patient however seems reluctant to undergo surgery at this time  Hypertension  Home meds: Norvasc 10 mg daily, Lotensin 40 mg daily, and hydrochlorothiazide 25 mg daily.   Other Stroke Risk Factors  Advanced age  Cigarette smoker, advised to stop smoking  ETOH use  Other Active Problems  Right  lower extremity cellulitis  Leukopenia  Hyponatremia - 125  Probable dehydration  Hospital day # 2        I have personally examined this patient, reviewed notes, independently viewed imaging studies, participated in medical decision making and plan of care. I have made any additions or clarifications directly to the above note.  Patient's clinical history ,neurological exam findings and MRI do not suggest a stroke. 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 Stroke team will sign off kindly call for questions Antony Contras, MD Medical Director [place] Pager: [phone] 04/27/2014 3:55 PM    Antony Contras, MD Stroke Neurology 04/27/2014 3:54 PM   To contact Stroke Continuity provider, please refer to [email or web address] After hours, contact General Neurology

## 2014-04-28 LAB — BASIC METABOLIC PANEL
Anion gap: 8 (ref 5–15)
BUN: 39 mg/dL — AB (ref 6–23)
CALCIUM: 8.2 mg/dL — AB (ref 8.4–10.5)
CHLORIDE: 88 meq/L — AB (ref 96–112)
CO2: 38 meq/L — AB (ref 19–32)
CREATININE: 0.94 mg/dL (ref 0.50–1.35)
GFR calc Af Amer: >90 mL/min (ref >90)
GFR calc non Af Amer: 84 mL/min — ABNORMAL LOW (ref >90)
Glucose, Bld: 112 mg/dL — ABNORMAL HIGH (ref 70–99)
Potassium: 4.4 mEq/L (ref 3.7–5.3)
Sodium: 134 mEq/L — ABNORMAL LOW (ref 137–147)

## 2014-04-28 MED ORDER — AMOXICILLIN-POT CLAVULANATE 875-125 MG PO TABS: 1.0000 | ORAL_TABLET | Freq: Two times a day (BID) | ORAL | Status: DC

## 2014-04-28 MED ORDER — GUAIFENESIN ER 600 MG PO TB12: 600.0000 mg | ORAL_TABLET | Freq: Two times a day (BID) | ORAL | Status: DC

## 2014-04-28 MED ORDER — NICOTINE 14 MG/24HR TD PT24: 14.0000 mg | MEDICATED_PATCH | Freq: Every day | TRANSDERMAL | Status: DC

## 2014-04-28 MED ORDER — ASPIRIN 325 MG PO TABS: 325.0000 mg | ORAL_TABLET | Freq: Every day | ORAL | Status: AC

## 2014-04-28 MED ORDER — FUROSEMIDE 40 MG PO TABS: 40.0000 mg | ORAL_TABLET | Freq: Every day | ORAL | Status: DC

## 2014-04-28 MED ORDER — AMLODIPINE BESYLATE 5 MG PO TABS: 5.0000 mg | ORAL_TABLET | Freq: Every day | ORAL | Status: DC

## 2014-04-28 MED ORDER — PREDNISONE (PAK) 10 MG PO TABS: ORAL_TABLET | ORAL | Status: DC

## 2014-04-28 NOTE — Progress Notes (Signed)
Subjective: Patient resting in bed, comfortable.  No new complaints.  Objective: Vital signs in last 24 hours: Filed Vitals:   04/27/14 2052 04/27/14 2106 04/28/14 0213 04/28/14 0658  BP:  122/64 132/70 118/66  Pulse: 69 84 49 43  Temp:  97.7 F (36.5 C) 97.7 F (36.5 C) 97.5 F (36.4 C)  TempSrc:  Oral Axillary Oral  Resp: 18 17 18 18   Height:      Weight:      SpO2: 96% 97% 96% 96%    Intake/Output from previous day: 12/07 0701 - 12/08 0700 In: 1000 [P.O.:1000] Out: 2101 [Urine:2100; Stool:1] Intake/Output this shift:    Physical Exam:  Strength through right upper shoulder and he remains 5/5.  Significant weakness of the left upper extremity persists, unchanged from yesterday's exam.  Mild bilateral iliopsoas weakness, but distal lower extremity strength is 5/5.  Overall no change in motor examination today.  CBC  Recent Labs  04/26/14 0139 04/27/14 0627  WBC 3.2* 6.9  HGB 16.5 16.2  HCT 47.5 48.6  PLT 136* 131*   BMET  Recent Labs  04/27/14 0627 04/28/14 0438  NA 131* 134*  K 4.2 4.4  CL 84* 88*  CO2 37* 38*  GLUCOSE 84 112*  BUN 50* 39*  CREATININE 1.04 0.94  CALCIUM 8.4 8.2*   ABG No results found for: PHART, PCO2ART, PO2ART, HCO3, TCO2, ACIDBASEDEF, O2SAT  Studies/Results: Mr Cervical Spine W Wo Contrast  04/26/2014   CLINICAL DATA:  Left upper and lower extremity weakness over the course of 8-9 days. Progressive shortness of breath and leg swelling.  EXAM: MRI CERVICAL AND LUMBAR SPINE WITHOUT AND WITH CONTRAST  TECHNIQUE: Multiplanar and multiecho pulse sequences of the cervical spine and thoracic spine were obtained without and with intravenous contrast.  CONTRAST:  58mL MULTIHANCE GADOBENATE DIMEGLUMINE 529 MG/ML IV SOLN  COMPARISON:  MRI brain 04/25/2014  FINDINGS: MRI CERVICAL SPINE FINDINGS  Normal signal is present in the cervical spinal cord. The craniocervical junction is within normal limits. The visualized intracranial contents are  normal. Flow is present in the vertebral arteries.  C2-3: Mild left-sided facet hypertrophy is present. There is no significant stenosis.  C3-4: A broad-based disc osteophyte complex partially effaces the ventral CSF. Uncovertebral and facet spurring results in moderate foraminal stenosis bilaterally, left greater than right.  C4-5: A broad-based disc osteophyte complex effaces the ventral CSF and distorts the ventral surface the cord. The canal is narrowed to 9 mm. Severe left and moderate right foraminal stenosis is secondary to uncovertebral spurring.  C5-6: A rightward disc osteophyte complex effaces the ventral CSF on the right with some distortion of the right side of the cord but no significant cord signal abnormality. Moderate to severe right foraminal stenosis secondary to uncovertebral spurring. The left foramen is patent.  C6-7: A broad-based disc osteophyte complex is present. There is minimal effacement of the ventral CSF. Moderate foraminal narrowing is evident bilaterally due to uncovertebral disease, worse on the right.  C7-T1: Mild uncovertebral spurring is worse on the right with mild right foraminal stenosis.  The postcontrast images demonstrate no pathologic enhancement.  MRI THORACIC SPINE FINDINGS  Normal signal is present throughout the thoracic spinal cord the conus medullaris which terminates at L1, within normal limits. Marrow signal, vertebral body heights, and alignment are normal. Schmorl's nodes are present at multiple levels in the lower thoracic spine beginning at T8-9. There is no significant kyphosis.  T1-2: Mild left foraminal narrowing is due to facet hypertrophy.  The central canal is patent.  T2-3:  Negative.  T3-4:  Negative.  T4-5:  Negative.  T5-6: A shallow central disc protrusion is present without significant stenosis.  T6-7: A shallow central disc protrusion is present. There is no significant stenosis.  T7-8: A central disc protrusion slightly distorts the ventral surface  cord without significant foraminal narrowing.  T8-9: A shallow central disc protrusion is present without significant stenosis.  T9-10: A leftward disc protrusion is present. There is no significant stenosis.  T10-11: Mild facet hypertrophy is present without significant stenosis.  T11-12: Negative.  IMPRESSION: 1. 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 2. Moderate foraminal stenosis bilaterally at C3-4 is worse on the left. 3. Moderate central canal narrowing at C4-5 and C5-6. 4. Mild left-sided facet hypertrophy at C2-3. 5. Moderate to severe right foraminal stenosis at C5-6. 6. Moderate foraminal narrowing bilaterally at C6-7. 7. Disc protrusion at T7-8 slightly distorts the ventral surface of cord without abnormal signal. 8. Shallow disc protrusions otherwise T5-6 through T9-10 without other significant stenoses.   Electronically Signed   By: Lawrence Santiago M.D.   On: 04/26/2014 18:12   Mr Thoracic Spine W Wo Contrast  04/26/2014   CLINICAL DATA:  Left upper and lower extremity weakness over the course of 8-9 days. Progressive shortness of breath and leg swelling.  EXAM: MRI CERVICAL AND LUMBAR SPINE WITHOUT AND WITH CONTRAST  TECHNIQUE: Multiplanar and multiecho pulse sequences of the cervical spine and thoracic spine were obtained without and with intravenous contrast.  CONTRAST:  74mL MULTIHANCE GADOBENATE DIMEGLUMINE 529 MG/ML IV SOLN  COMPARISON:  MRI brain 04/25/2014  FINDINGS: MRI CERVICAL SPINE FINDINGS  Normal signal is present in the cervical spinal cord. The craniocervical junction is within normal limits. The visualized intracranial contents are normal. Flow is present in the vertebral arteries.  C2-3: Mild left-sided facet hypertrophy is present. There is no significant stenosis.  C3-4: A broad-based disc osteophyte complex partially effaces the ventral CSF. Uncovertebral and facet spurring results in moderate foraminal stenosis bilaterally, left greater than right.  C4-5: A  broad-based disc osteophyte complex effaces the ventral CSF and distorts the ventral surface the cord. The canal is narrowed to 9 mm. Severe left and moderate right foraminal stenosis is secondary to uncovertebral spurring.  C5-6: A rightward disc osteophyte complex effaces the ventral CSF on the right with some distortion of the right side of the cord but no significant cord signal abnormality. Moderate to severe right foraminal stenosis secondary to uncovertebral spurring. The left foramen is patent.  C6-7: A broad-based disc osteophyte complex is present. There is minimal effacement of the ventral CSF. Moderate foraminal narrowing is evident bilaterally due to uncovertebral disease, worse on the right.  C7-T1: Mild uncovertebral spurring is worse on the right with mild right foraminal stenosis.  The postcontrast images demonstrate no pathologic enhancement.  MRI THORACIC SPINE FINDINGS  Normal signal is present throughout the thoracic spinal cord the conus medullaris which terminates at L1, within normal limits. Marrow signal, vertebral body heights, and alignment are normal. Schmorl's nodes are present at multiple levels in the lower thoracic spine beginning at T8-9. There is no significant kyphosis.  T1-2: Mild left foraminal narrowing is due to facet hypertrophy. The central canal is patent.  T2-3:  Negative.  T3-4:  Negative.  T4-5:  Negative.  T5-6: A shallow central disc protrusion is present without significant stenosis.  T6-7: A shallow central disc protrusion is present. There is no significant stenosis.  T7-8: A central disc protrusion slightly distorts the ventral surface cord without significant foraminal narrowing.  T8-9: A shallow central disc protrusion is present without significant stenosis.  T9-10: A leftward disc protrusion is present. There is no significant stenosis.  T10-11: Mild facet hypertrophy is present without significant stenosis.  T11-12: Negative.  IMPRESSION: 1. Severe left and  moderate right foraminal stenosis at C4-5. 2. Moderate foraminal stenosis bilaterally at C3-4 is worse on the left. 3. Moderate central canal narrowing at C4-5 and C5-6. 4. Mild left-sided facet hypertrophy at C2-3. 5. Moderate to severe right foraminal stenosis at C5-6. 6. Moderate foraminal narrowing bilaterally at C6-7. 7. Disc protrusion at T7-8 slightly distorts the ventral surface of cord without abnormal signal. 8. Shallow disc protrusions otherwise T5-6 through T9-10 without other significant stenoses.   Electronically Signed   By: Lawrence Santiago M.D.   On: 04/26/2014 18:12    Assessment/Plan: Spoke with patient at length at bedside.  Discussed again the nature of his condition, options for treatment ranging from surgical intervention to continued nonsurgical management, including physical therapy.  We discussed the risks of surgery and anesthesia which are increased for this patient due to his medical comorbidities, the risks of going without surgery with the risk of progressive neurologic dysfunction, and the uncertainty of clinical improvement with surgery.  After discussing all of this the patient does not want to proceed with surgery, and wishes instead to continue with rehabilitation including physical therapy and follow-up with his primary physician.  This is certainly reasonable, and at this point I will sign off the case.  His questions were answered for him.   Hosie Spangle, MD 04/28/2014, 9:45 AM

## 2014-04-28 NOTE — Discharge Summary (Addendum)
Physician Discharge Summary  Luke Pierce KKX:[id number] DOB: Mar 20, 1946 DOA: 04/25/2014  PCP: Ruben Reason, MD  Admit date: 04/25/2014 Discharge date: 04/28/2014  Time spent: 45 minutes  Recommendations for Outpatient Follow-up:  Patient will be discharged to home with home health physical therapy. Patient will need to follow-up with his primary care physician within one week of discharge. Patient to continue taking his medications as prescribed. Patient may need to follow-up with neurosurgery as needed. Patient will be discharged with home oxygen.  Patient to follow a heart healthy diet with 1500 mL fluid obstruction per day.  Patient will need to have a repeat BMP within 1 week of discharge.  Discharge Diagnoses:  Left upper extremity weakness Low extremity swelling Community-acquired pneumonia Right lower injury cellulitis Acute kidney injury Hypertension COPD with mild exacerbation Tobacco Abuse Elevated i-STAT troponin Polycythemia   Discharge Condition: Stable  Diet recommendation: Heart healthy with 1500 mL fluid resection per day  Filed Weights   04/26/14 0500 04/26/14 0829 04/27/14 0500  Weight: 76.159 kg (167 lb 14.4 oz) 74.753 kg (164 lb 12.8 oz) 73.392 kg (161 lb 12.8 oz)    History of present illness:  on 04/25/2014 by Dr. Flonnie Overman Dhungel 68 year old male with history of COPD, active tobacco use, hypertension presented to the ED with progressive weakness of his left upper extremity followed by his left lower extremity, progressive shortness of breath and leg swellings. Patient reports being fine 2 weeks back when he started having progressive dyspnea on exertion. This this was associated with nonproductive cough and increasing bilateral leg swellings which she has been noticing for the past 6 -8 weeks. On the day of Thanksgiving (9 days back) he developed some numbness and weakness of his left arm but since he had family in town he did not want to come to the  hospital. The weakness was off and on but persistent and progressed with significant weakness on his right upper arm being unable to lift it up above the horizontal. He also started noticing weakness of his left leg for past 2-3 days. For the past 2 days he also dyspnea on minimal exertion and mild orthopnea. Patient denies headache, dizziness, fever, chills, nausea , vomiting, chest pain, palpitations, SOB, abdominal pain, bowel or urinary symptoms. Denies any sick contacts, recent illness or recent travel. He reports being compliant with his medications but not with his diet. He continues to smoke.  Hospital Course:  Left upper ext weakness -Patient only has continued weakness in the LUE -CT head: No acute intracranial pathology -MRI brain: Mild atrophy and small vessel disease, no acute intracranial findings -Pending echocardiogram -Carotid Doppler: 1-39% ICA stenosis, vertebral artery flow is antegrade -Lipid panel: TC 94, TG 46, HDL 56, LDL 29 -Hemoglobin A1c 5.7 -PT and OT consulted- recommended home health -Neurology consultation appreciated and recommended cervical and thoracic MRI -Continue aspirin (of note, patient states that he was not compliant with his daily aspirin regimen.) -Cervical and Thoracic MRI: 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  Community-acquired pneumonia -CXR: Left lower lobe airspace disease, atelectasis versus chronic versus  pneumonia -Patient placed on vancomycin and cefepime, avoided quinolones due to prolonged QTC -Strep pneumonia and legionella urine antigens negative -Blood cultures negative to date -Will discharge patient with high dose Augmentin  Right lower extremity cellulitis -Resolved, was placed on vancomycin  Hyponatremia -Likely secondary to volume overload as patient does appear to be hypovolemic -Continue diuresis with strict fluid restriction -Na134  Acute kidney injury -Resolved  Hypertension -Allowing for permissive hypertension, ACE inhibitor, amlodipine, but may resume at discharge -Continue Lasix  -Discontinued HCTZ  COPD with mild exacerbation -Continue duonebs as necessary -Continue antitussives, prednisone, supplemental oxygen, Dulera -Complicated by continued smoking  Tobacco Abuse -Smoking cessation counseling given -Continue nicotine patch  Elevated I-Stat Troponin -Troponins have been negative, patient denies any chest pain at this time  Polycythemia -Patient does have history of smoking and COPD  Procedures: Carotid Doppler: 1-39% ICA stenosis, vertebral artery flow is antegrade  Echocardiogram  Consultations: Neurology Neurosurgery   Discharge Exam: Filed Vitals:   04/28/14 1009  BP: 102/54  Pulse: 53  Temp: 98.2 F (36.8 C)  Resp: 17     General: Well developed, well nourished, NAD, appears stated age  HEENT: NCAT, mucous membranes moist.  Cardiovascular: S1 S2 auscultated, no rubs, murmurs or gallops. Regular rate and rhythm.  Respiratory: Diminished breath sounds however are clear  Abdomen: Soft, nontender, nondistended, + bowel sounds  Extremities: warm dry without cyanosis clubbing or edema  Neuro: AAOx3, no focal deficits, patient does have some weakness noted in the left upper extremity as compared to his other extremities.   Psych: Appropriate mood and affect  Discharge Instructions      Discharge Instructions    Discharge  instructions    Complete by:  As directed   Patient will be discharged to home with home health physical therapy. Patient will need to follow-up with his primary care physician within one week of discharge. Patient to continue taking his medications as prescribed. Patient may need to follow-up with neurosurgery as needed. Patient will be discharged with home oxygen.  Patient to follow a heart healthy diet with 1500 mL fluid obstruction per day.  Patient will need to have a repeat BMP within 1 week of discharge.            Medication List    STOP taking these medications        clindamycin 300 MG capsule  Commonly known as:  CLEOCIN     hydrochlorothiazide 25 MG tablet  Commonly known as:  HYDRODIURIL      TAKE these medications        amLODipine 5 MG tablet  Commonly known as:  NORVASC  Take 1 tablet (5 mg total) by mouth daily.     amoxicillin-clavulanate 875-125 MG per tablet  Commonly known as:  AUGMENTIN  Take 1 tablet by mouth 2 (two) times daily.     aspirin 325 MG tablet  Take 1 tablet (325 mg total) by mouth daily.     benazepril 40 MG tablet  Commonly known as:  LOTENSIN  TAKE 1 TABLET (40 MG TOTAL) BY MOUTH DAILY.     fluticasone-salmeterol 230-21 MCG/ACT inhaler  Commonly known as:  ADVAIR HFA  Inhale 2 puffs into the lungs 2 (two) times daily.     furosemide 40 MG tablet  Commonly known as:  LASIX  Take 1 tablet (40 mg total) by mouth daily.     guaiFENesin 600 MG 12 hr tablet  Commonly known as:  MUCINEX  Take 1  tablet (600 mg total) by mouth 2 (two) times daily.     nicotine 14 mg/24hr patch  Commonly known as:  NICODERM CQ - dosed in mg/24 hours  Place 1 patch (14 mg total) onto the skin daily.     predniSONE 10 MG tablet  Commonly known as:  STERAPRED UNI-PAK  Prednisone dosing: Take  Prednisone 40mg  (4 tabs) x 3 days, then taper to 30mg  (3 tabs) x 3 days, then 20mg  (2 tabs) x 3days, then 10mg  (1 tab) x 3days, then OFF.     PROAIR HFA 108 (90  BASE) MCG/ACT inhaler  Generic drug:  albuterol  Inhale 2 puffs into the lungs every 6 (six) hours as needed for wheezing or shortness of breath.       No Known Allergies Follow-up Information    Follow up with [name],[name], MD. Schedule an appointment as soon as possible for a visit in 1 week.   Specialty:  Family Medicine   Why:  Hospital followup, repeat BMP   Contact information:   [place] [address] [phone]       Follow up with Hosie Spangle, MD.   Specialty:  Neurosurgery   Why:  As needed   Contact information:   [address] [place]. [place] [address] [phone]        The results of significant diagnostics from this hospitalization (including imaging, microbiology, ancillary and laboratory) are listed below for reference.    Significant Diagnostic Studies: Dg Chest 2 View  04/25/2014   CLINICAL DATA:  Shortness of breath, history of CHF and COPD. Progressive weakness. Bilateral leg swelling.  EXAM: CHEST  2 VIEW  COMPARISON:  08/17/2013  FINDINGS: The lungs are hyperinflated likely secondary to COPD. There is hazy left lower lobe airspace disease. There is no pleural effusion or pneumothorax. There is mild bilateral diffuse interstitial thickening which is likely chronic. The heart and mediastinal contours are unremarkable.  The osseous structures are unremarkable.  IMPRESSION: Hazy left lower lobe airspace disease at the site of previous airspace abnormality. This may reflect atelectasis versus scarring versus pneumonia.   Electronically Signed   By: Kathreen Devoid   On: 04/25/2014 15:30   Ct Head Wo Contrast  04/25/2014   CLINICAL DATA:  Left arm weakness, denies fall or trauma, large hematoma over the left forearm.  EXAM: CT HEAD WITHOUT CONTRAST  TECHNIQUE: Contiguous axial images were obtained from the base of the skull through the vertex without intravenous contrast.  COMPARISON:  None.  FINDINGS: There is no evidence of mass  effect, midline shift, or extra-axial fluid collections. There is no evidence of a space-occupying lesion or intracranial hemorrhage. There is no evidence of a cortical-based area of acute infarction. There is generalized cerebral atrophy.  The ventricles and sulci are appropriate for the patient's age. The basal cisterns are patent.  Visualized portions of the orbits are unremarkable. The visualized portions of the paranasal sinuses and mastoid air cells are unremarkable. Cerebrovascular atherosclerotic calcifications are noted.  The osseous structures are unremarkable.  IMPRESSION: 1. No acute intracranial pathology. 2. Mild generalized cerebral atrophy.   Electronically Signed   By: Kathreen Devoid   On: 04/25/2014 15:54   Mr Jodene Nam Head Wo Contrast  04/25/2014   CLINICAL DATA:  Two week history of LEFT upper and lower extremity weakness. Stroke risk factors include hypertension and smoking.  EXAM: MRI HEAD WITHOUT CONTRAST  MRA HEAD WITHOUT CONTRAST  TECHNIQUE: Multiplanar, multiecho pulse sequences of  the brain and surrounding structures were obtained without intravenous contrast. Angiographic images of the head were obtained using MRA technique without contrast.  COMPARISON:  CT head earlier today.  FINDINGS: MRI HEAD FINDINGS  No evidence for acute infarction, hemorrhage, mass lesion, hydrocephalus, or extra-axial fluid. Mild cerebral and cerebellar atrophy. Mild subcortical and periventricular T2 and FLAIR hyperintensities, likely chronic microvascular ischemic change. Normal pituitary and cerebellar tonsils. Mild pannus surrounds the odontoid. No areas of chronic hemorrhage. Extracranial soft tissues appear unremarkable.  MRA HEAD FINDINGS  The internal carotid arteries are mildly irregular in cavernous and supraclinoid segments but there is no flow-limiting stenosis. The basilar artery is widely patent with both vertebrals contributing. Fetal origin LEFT PCA. No proximal intracranial flow-limiting stenosis or  aneurysm. Mild irregularity both superior cerebellar artery vessels proximally.  IMPRESSION: Mild atrophy and small vessel disease. No acute intracranial findings.  Mild pannus without visible cervical cord compression.  Minor non stenotic irregularity of the intracranial vasculature without proximal flow limiting stenosis.   Electronically Signed   By: Rolla Flatten M.D.   On: 04/25/2014 19:58   Mr Brain Wo Contrast  04/25/2014   CLINICAL DATA:  Two week history of LEFT upper and lower extremity weakness. Stroke risk factors include hypertension and smoking.  EXAM: MRI HEAD WITHOUT CONTRAST  MRA HEAD WITHOUT CONTRAST  TECHNIQUE: Multiplanar, multiecho pulse sequences of the brain and surrounding structures were obtained without intravenous contrast. Angiographic images of the head were obtained using MRA technique without contrast.  COMPARISON:  CT head earlier today.  FINDINGS: MRI HEAD FINDINGS  No evidence for acute infarction, hemorrhage, mass lesion, hydrocephalus, or extra-axial fluid. Mild cerebral and cerebellar atrophy. Mild subcortical and periventricular T2 and FLAIR hyperintensities, likely chronic microvascular ischemic change. Normal pituitary and cerebellar tonsils. Mild pannus surrounds the odontoid. No areas of chronic hemorrhage. Extracranial soft tissues appear unremarkable.  MRA HEAD FINDINGS  The internal carotid arteries are mildly irregular in cavernous and supraclinoid segments but there is no flow-limiting stenosis. The basilar artery is widely patent with both vertebrals contributing. Fetal origin LEFT PCA. No proximal intracranial flow-limiting stenosis or aneurysm. Mild irregularity both superior cerebellar artery vessels proximally.  IMPRESSION: Mild atrophy and small vessel disease. No acute intracranial findings.  Mild pannus without visible cervical cord compression.  Minor non stenotic irregularity of the intracranial vasculature without proximal flow limiting stenosis.    Electronically Signed   By: Rolla Flatten M.D.   On: 04/25/2014 19:58   Mr Cervical Spine W Wo Contrast  04/26/2014   CLINICAL DATA:  Left upper and lower extremity weakness over the course of 8-9 days. Progressive shortness of breath and leg swelling.  EXAM: MRI CERVICAL AND LUMBAR SPINE WITHOUT AND WITH CONTRAST  TECHNIQUE: Multiplanar and multiecho pulse sequences of the cervical spine and thoracic spine were obtained without and with intravenous contrast.  CONTRAST:  22mL MULTIHANCE GADOBENATE DIMEGLUMINE 529 MG/ML IV SOLN  COMPARISON:  MRI brain 04/25/2014  FINDINGS: MRI CERVICAL SPINE FINDINGS  Normal signal is present in the cervical spinal cord. The craniocervical junction is within normal limits. The visualized intracranial contents are normal. Flow is present in the vertebral arteries.  C2-3: Mild left-sided facet hypertrophy is present. There is no significant stenosis.  C3-4: A broad-based disc osteophyte complex partially effaces the ventral CSF. Uncovertebral and facet spurring results in moderate foraminal stenosis bilaterally, left greater than right.  C4-5: A broad-based disc osteophyte complex effaces the ventral CSF and distorts the ventral surface the cord.  The canal is narrowed to 9 mm. Severe left and moderate right foraminal stenosis is secondary to uncovertebral spurring.  C5-6: A rightward disc osteophyte complex effaces the ventral CSF on the right with some distortion of the right side of the cord but no significant cord signal abnormality. Moderate to severe right foraminal stenosis secondary to uncovertebral spurring. The left foramen is patent.  C6-7: A broad-based disc osteophyte complex is present. There is minimal effacement of the ventral CSF. Moderate foraminal narrowing is evident bilaterally due to uncovertebral disease, worse on the right.  C7-T1: Mild uncovertebral spurring is worse on the right with mild right foraminal stenosis.  The postcontrast images demonstrate no  pathologic enhancement.  MRI THORACIC SPINE FINDINGS  Normal signal is present throughout the thoracic spinal cord the conus medullaris which terminates at L1, within normal limits. Marrow signal, vertebral body heights, and alignment are normal. Schmorl's nodes are present at multiple levels in the lower thoracic spine beginning at T8-9. There is no significant kyphosis.  T1-2: Mild left foraminal narrowing is due to facet hypertrophy. The central canal is patent.  T2-3:  Negative.  T3-4:  Negative.  T4-5:  Negative.  T5-6: A shallow central disc protrusion is present without significant stenosis.  T6-7: A shallow central disc protrusion is present. There is no significant stenosis.  T7-8: A central disc protrusion slightly distorts the ventral surface cord without significant foraminal narrowing.  T8-9: A shallow central disc protrusion is present without significant stenosis.  T9-10: A leftward disc protrusion is present. There is no significant stenosis.  T10-11: Mild facet hypertrophy is present without significant stenosis.  T11-12: Negative.  IMPRESSION: 1. 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 2. Moderate foraminal stenosis bilaterally at C3-4 is worse on the left. 3. Moderate central canal narrowing at C4-5 and C5-6. 4. Mild left-sided facet hypertrophy at C2-3. 5. Moderate to severe right foraminal stenosis at C5-6. 6. Moderate foraminal narrowing bilaterally at C6-7. 7. Disc protrusion at T7-8 slightly distorts the ventral surface of cord without abnormal signal. 8. Shallow disc protrusions otherwise T5-6 through T9-10 without other significant stenoses.   Electronically Signed   By: Lawrence Santiago M.D.   On: 04/26/2014 18:12   Mr Thoracic Spine W Wo Contrast  04/26/2014   CLINICAL DATA:  Left upper and lower extremity weakness over the course of 8-9 days. Progressive shortness of breath and leg swelling.  EXAM: MRI CERVICAL AND LUMBAR SPINE WITHOUT AND WITH CONTRAST  TECHNIQUE:  Multiplanar and multiecho pulse sequences of the cervical spine and thoracic spine were obtained without and with intravenous contrast.  CONTRAST:  20mL MULTIHANCE GADOBENATE DIMEGLUMINE 529 MG/ML IV SOLN  COMPARISON:  MRI brain 04/25/2014  FINDINGS: MRI CERVICAL SPINE FINDINGS  Normal signal is present in the cervical spinal cord. The craniocervical junction is within normal limits. The visualized intracranial contents are normal. Flow is present in the vertebral arteries.  C2-3: Mild left-sided facet hypertrophy is present. There is no significant stenosis.  C3-4: A broad-based disc osteophyte complex partially effaces the ventral CSF. Uncovertebral and facet spurring results in moderate foraminal stenosis bilaterally, left greater than right.  C4-5: A broad-based disc osteophyte complex effaces the ventral CSF and distorts the ventral surface the cord. The canal is narrowed to 9 mm. Severe left and moderate right foraminal stenosis is secondary to uncovertebral spurring.  C5-6: A rightward disc osteophyte complex effaces the ventral CSF on the right with some distortion of the right side of the cord  but no significant cord signal abnormality. Moderate to severe right foraminal stenosis secondary to uncovertebral spurring. The left foramen is patent.  C6-7: A broad-based disc osteophyte complex is present. There is minimal effacement of the ventral CSF. Moderate foraminal narrowing is evident bilaterally due to uncovertebral disease, worse on the right.  C7-T1: Mild uncovertebral spurring is worse on the right with mild right foraminal stenosis.  The postcontrast images demonstrate no pathologic enhancement.  MRI THORACIC SPINE FINDINGS  Normal signal is present throughout the thoracic spinal cord the conus medullaris which terminates at L1, within normal limits. Marrow signal, vertebral body heights, and alignment are normal. Schmorl's nodes are present at multiple levels in the lower thoracic spine beginning at  T8-9. There is no significant kyphosis.  T1-2: Mild left foraminal narrowing is due to facet hypertrophy. The central canal is patent.  T2-3:  Negative.  T3-4:  Negative.  T4-5:  Negative.  T5-6: A shallow central disc protrusion is present without significant stenosis.  T6-7: A shallow central disc protrusion is present. There is no significant stenosis.  T7-8: A central disc protrusion slightly distorts the ventral surface cord without significant foraminal narrowing.  T8-9: A shallow central disc protrusion is present without significant stenosis.  T9-10: A leftward disc protrusion is present. There is no significant stenosis.  T10-11: Mild facet hypertrophy is present without significant stenosis.  T11-12: Negative.  IMPRESSION: 1. 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 2. Moderate foraminal stenosis bilaterally at C3-4 is worse on the left. 3. Moderate central canal narrowing at C4-5 and C5-6. 4. Mild left-sided facet hypertrophy at C2-3. 5. Moderate to severe right foraminal stenosis at C5-6. 6. Moderate foraminal narrowing bilaterally at C6-7. 7. Disc protrusion at T7-8 slightly distorts the ventral surface of cord without abnormal signal. 8. Shallow disc protrusions otherwise T5-6 through T9-10 without other significant stenoses.   Electronically Signed   By: Lawrence Santiago M.D.   On: 04/26/2014 18:12    Microbiology: Recent Results (from the past 240 hour(s))  Culture, blood (routine x 2) Call MD if unable to obtain prior to antibiotics being given     Status: None (Preliminary result)   Collection Time: 04/25/14  8:10 PM  Result Value Ref Range Status   Specimen Description BLOOD RIGHT ARM  Final   Special Requests   Final    BOTTLES DRAWN AEROBIC AND ANAEROBIC 10CC BLUE, 8CC RED   Culture  Setup Time   Final    04/26/2014 03:55 Performed at [facility]    Culture   Final           BLOOD CULTURE RECEIVED NO GROWTH TO DATE CULTURE WILL BE HELD FOR 5 DAYS BEFORE  ISSUING A FINAL NEGATIVE REPORT Performed at [facility]    Report Status PENDING  Incomplete  Culture, blood (routine x 2) Call MD if unable to obtain prior to antibiotics being given     Status: None (Preliminary result)   Collection Time: 04/25/14  8:18 PM  Result Value Ref Range Status   Specimen Description BLOOD LEFT HAND  Final   Special Requests BOTTLES DRAWN AEROBIC AND ANAEROBIC [place]  Final   Culture  Setup Time   Final    04/26/2014 03:55 Performed at [facility]    Culture   Final           BLOOD CULTURE RECEIVED NO GROWTH TO DATE CULTURE WILL BE HELD FOR 5 DAYS BEFORE ISSUING A FINAL  NEGATIVE REPORT Performed at [facility]    Report Status PENDING  Incomplete     Labs: Basic Metabolic Panel:  Recent Labs Lab 04/25/14 1408 04/26/14 0139 04/27/14 0627 04/28/14 0438  NA 123* 125* 131* 134*  K 5.2 4.6 4.2 4.4  CL 78* 84* 84* 88*  CO2 32 28 37* 38*  GLUCOSE 86 130* 84 112*  BUN 67* 53* 50* 39*  CREATININE 1.38* 1.05 1.04 0.94  CALCIUM 8.4 8.0* 8.4 8.2*   Liver Function Tests:  Recent Labs Lab 04/25/14 1408  AST 32  ALT 37  ALKPHOS 53  BILITOT 0.9  PROT 6.2  ALBUMIN 3.5   No results for input(s): LIPASE, AMYLASE in the last 168 hours. No results for input(s): AMMONIA in the last 168 hours. CBC:  Recent Labs Lab 04/25/14 1408 04/26/14 0139 04/27/14 0627  WBC 6.4 3.2* 6.9  NEUTROABS 5.2  --   --   HGB 18.1* 16.5 16.2  HCT 52.2* 47.5 48.6  MCV 90.0 89.1 91.4  PLT 150 136* 131*   Cardiac Enzymes:  Recent Labs Lab 04/25/14 1436 04/25/14 2010 04/26/14 0139 04/26/14 0914  TROPONINI <0.30 <0.30 <0.30 <0.30   BNP: BNP (last 3 results)  Recent Labs  04/25/14 1654  PROBNP 5975.0*   CBG: No results for input(s): GLUCAP in the last 168 hours.   SignedCristal Ford  [facility] 04/28/2014, 11:09 AM

## 2014-04-28 NOTE — Progress Notes (Signed)
STROKE TEAM PROGRESS NOTE   HISTORY Luke Pierce is a 68 y.o. male with a history of COPD, hypertension, alcohol dependence, tobacco dependence and congestive heart failure, who presents to emergency department today for evaluation of left arm and leg weakness. The patient states that he started having difficulties with his left arm around Thanksgiving. He did not report this to his family because he did not want to disrupt the holidays. He states his symptoms have been gradually getting worse. Approximately 1 week after he developed left arm problems his left leg started to "act up". Apparently his leg has been intermittently weak; however, he is still able to walk; although he has to be more careful to avoid falling. He denies pain in either extremity. He feels that the weakness is becoming worse. He denies other associated symptoms except for lower extremity edema which is been going on for several weeks and increased shortness of breath. He has a cough that has been nonproductive. He takes aspirin occasionally but not on a regular basis.  Date last known well: Unable to determine Time last known well: Unable to determine tPA Given: No: Beyond the window for therapeutic treatment.   SUBJECTIVE (INTERVAL HISTORY) There are multiple family members present. The patient notes that he is still having weakness of his proximal left upper extremity. But he feels it is improving and he would rather do physical therapy and not undergo neck surgery as he feels he is not a good surgical candidate. OBJECTIVE Temp:  [97.5 F (36.4 C)-98.2 F (36.8 C)] 98.2 F (36.8 C) (12/08 1009) Pulse Rate:  [43-53] 53 (12/08 1009) Cardiac Rhythm:  [-]  Resp:  [17-18] 17 (12/08 1009) BP: (102-132)/(54-70) 102/54 mmHg (12/08 1009) SpO2:  [94 %-96 %] 94 % (12/08 1037)  No results for input(s): GLUCAP in the last 168 hours.  Recent Labs Lab 04/25/14 1408 04/26/14 0139 04/27/14 0627 04/28/14 0438  NA 123* 125*  131* 134*  K 5.2 4.6 4.2 4.4  CL 78* 84* 84* 88*  CO2 32 28 37* 38*  GLUCOSE 86 130* 84 112*  BUN 67* 53* 50* 39*  CREATININE 1.38* 1.05 1.04 0.94  CALCIUM 8.4 8.0* 8.4 8.2*    Recent Labs Lab 04/25/14 1408  AST 32  ALT 37  ALKPHOS 53  BILITOT 0.9  PROT 6.2  ALBUMIN 3.5    Recent Labs Lab 04/25/14 1408 04/26/14 0139 04/27/14 0627  WBC 6.4 3.2* 6.9  NEUTROABS 5.2  --   --   HGB 18.1* 16.5 16.2  HCT 52.2* 47.5 48.6  MCV 90.0 89.1 91.4  PLT 150 136* 131*    Recent Labs Lab 04/25/14 1436 04/25/14 2010 04/26/14 0139 04/26/14 0914  TROPONINI <0.30 <0.30 <0.30 <0.30   No results for input(s): LABPROT, INR in the last 72 hours. No results for input(s): COLORURINE, LABSPEC, [place], GLUCOSEU, HGBUR, BILIRUBINUR, KETONESUR, PROTEINUR, UROBILINOGEN, NITRITE, LEUKOCYTESUR in the last 72 hours.  Invalid input(s): APPERANCEUR     Component Value Date/Time   CHOL 94 04/26/2014 0139   TRIG 46 04/26/2014 0139   HDL 56 04/26/2014 0139   CHOLHDL 1.7 04/26/2014 0139   VLDL 9 04/26/2014 0139   LDLCALC 29 04/26/2014 0139   Lab Results  Component Value Date   HGBA1C 5.7* 04/26/2014   No results found for: LABOPIA, COCAINSCRNUR, LABBENZ, AMPHETMU, THCU, LABBARB  No results for input(s): ETH in the last 168 hours.  Dg Chest 2 View 04/25/2014    Hazy left lower lobe airspace disease at  the site of previous airspace abnormality. This may reflect atelectasis versus scarring versus pneumonia.     Ct Head Wo Contrast 04/25/2014    1. No acute intracranial pathology.  2. Mild generalized cerebral atrophy.     MRI /  Mra Head Wo Contrast 04/25/2014    Mild atrophy and small vessel disease. No acute intracranial findings.  Mild pannus without visible cervical cord compression.  Minor non stenotic irregularity of the intracranial vasculature without proximal flow limiting stenosis.     MRI C/T spine - pending  PHYSICAL EXAM  Temp:  [97.5 F (36.4 C)-98.2 F (36.8 C)] 98.2  F (36.8 C) (12/08 1009) Pulse Rate:  [43-53] 53 (12/08 1009) Resp:  [17-18] 17 (12/08 1009) BP: (102-132)/(54-70) 102/54 mmHg (12/08 1009) SpO2:  [94 %-96 %] 94 % (12/08 1037)  General - Well nourished, well developed middle aged Caucasian male, in no apparent distress.  Ophthalmologic - not able to see through due to likely cataracts.  Cardiovascular - Regular rate and rhythm with no murmur.  Mental Status -  Level of arousal and orientation to time, place, and person were intact. Language including expression, naming, repetition, comprehension was assessed and found intact.  Cranial Nerves II - XII - II - Visual field intact OU. III, IV, VI - Extraocular movements intact. V - Facial sensation intact bilaterally. VII - Facial movement intact bilaterally. VIII - Hearing & vestibular intact bilaterally. X - Palate elevates symmetrically. XI - Chin turning & shoulder shrug intact bilaterally. XII - Tongue protrusion intact.  Motor Strength - The patient's strength was normal in all extremities except left bicep and deltoid 3+/5 and tricep 4/5 and pronator drift was present.  Bulk was normal and fasciculations were absent.  Symmetric distal muscle strength in both hands. Motor Tone - Muscle tone was assessed at the neck and appendages and was normal.  Reflexes - The patient's reflexes were symmetrical except depressed bicep jerks bilaterally and exaggerated triceps and supinator jerks left greater than right. and he had no pathological reflexes.  Sensory - Light touch, temperature/pinprick were assessed and were grossly symmetrical.    Coordination - The patient had normal movements in the hands and feet with no ataxia or dysmetria.  Tremor was absent.  Gait and Station - cautious but steady. Romberg sign negative ASSESSMENT/PLAN Mr. Luke Pierce is a 68 y.o. male with history of COPD, hypertension, alcohol abuse, tobacco dependence, and congestive heart failure, presenting with  left UE weakness for 10 days. He did not receive IV t-PA secondary to late presentation.   Left UE weakness for 10 days - proximal weakness,   No upper neuron signs. No neck pain. 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  MRI  negative for stroke  MRA  no significant stenosis but small vessel disease.  Carotid Doppler - 1-39% ICA stenosis. Vertebral artery flow is antegrade.   2D Echo pending ( reading system is down)  LDL 29  HgbA1c 5.7  Subcutaneous heparin for VTE prophylaxis    with thin liquids  No antithrombotics prior to admission, now on aspirin 325 mg orally every day  Patient counseled to be compliant with his antithrombotic medications  Ongoing aggressive stroke risk factor management  Therapy recommendations:  Pending  Disposition:  Pending  ? Left UE radiculopathy - gradual onset - proximal > distal - no facial or LE involvement -  Probable cervical spine abnormality causing left upper extremity weakness. MRI C spine shows degeneartive changes and compression at C 4-5-  Which explains neurological findings of depressed biceps jerks and mild hyperreflexia D/W Neurosurgery Dr Sherwood Gambler patient has unrelaible history and surgery may be useful to prevent further worsening but no necessarily to regain the strength that he has lost in his left shoulder and arm. Patient however seems reluctant to undergo surgery at this time  Hypertension  Home meds: Norvasc 10 mg daily, Lotensin 40 mg daily, and hydrochlorothiazide 25 mg daily.   Other Stroke Risk Factors  Advanced age  Cigarette smoker, advised to stop smoking  ETOH use  Other Active Problems  Right lower extremity cellulitis  Leukopenia  Hyponatremia - 125  Probable dehydration  Hospital day # 3        I have personally examined this patient, reviewed notes, independently  viewed imaging studies, participated in medical decision making and plan of care. I have made any additions or clarifications directly to the above note.  Patient's clinical history ,neurological exam findings and MRI do not suggest a stroke. 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 Stroke team will sign off kindly call for questions Antony Contras, MD Medical Director [place] Pager: [phone] 04/28/2014 9:17 PM    Antony Contras, MD Stroke Neurology 04/28/2014 9:17 PM   To contact Stroke Continuity provider, please refer to [email or web address] After hours, contact General Neurology

## 2014-04-28 NOTE — Progress Notes (Signed)
Occupational Therapy Treatment Patient Details Name: Luke Pierce MRN: [id number] DOB: [birth year] Today's Date: 04/28/2014    History of present illness Patient is a 68 yo male admitted 04/25/14 with Lt-sided weakness (UE prior to LE), BLE edema, and SOB.  PMH:  COPD, CHF, HTN, ETOH and tobacco abuse, cellulitis RLE.   OT comments  Instructed pt in home safety, energy conservation and care of cervical spine to minimize further damage. Educated pt in progressing shoulder exercises in supine for scapular stability as pt tends to elevate his shoulder and lean to gain reach on the L.  Educated pt in the benefits of outpatient therapy.  Pt requesting more information, case manager notified.  Follow Up Recommendations  Outpatient OT (or PT to address L shoulder)    Equipment Recommendations       Recommendations for Other Services      Precautions / Restrictions Precautions Precautions: Fall Restrictions Weight Bearing Restrictions: No       Mobility Bed Mobility     General bed mobility comments: pt received in chair  Transfers Overall transfer level: Needs assistance Equipment used: None Transfers: Sit to/from Stand Sit to Stand: Supervision         General transfer comment: no unsteadiness with transfer    Balance             Standing balance-Leahy Scale: Good                     ADL Overall ADL's : Needs assistance/impaired     Grooming: Wash/dry hands;Standing;Supervision/safety;Oral care           Upper Body Dressing : Set up;Sitting Upper Body Dressing Details (indicate cue type and reason): instructed to dress L UE first due to limited mobility, pt uses front opening shirts at home. Lower Body Dressing: Min guard;Sit to/from stand Lower Body Dressing Details (indicate cue type and reason): crosses foot over opposite knee Toilet Transfer: Supervision/safety;RW;Comfort height toilet         Tub/Shower Transfer Details (indicate cue type  and reason): pt declined practice, contacted CM to get information about cost of [employer] tub seat, left message Functional mobility during ADLs: Supervision/safety;Rolling walker General ADL Comments: Instructed pt in body mechanics and ergonomics due to his cervical spine pathology. Place sits leaning on his desk to read a lot at home.  Instructed pt to place book at an angle an up higher to avoid prolonged neck flexion.      Vision                     Perception     Praxis      Cognition   Behavior During Therapy: WFL for tasks assessed/performed Overall Cognitive Status: Within Functional Limits for tasks assessed                       Extremity/Trunk Assessment               Exercises     Shoulder Instructions       General Comments      Pertinent Vitals/ Pain       Pain Assessment: No/denies pain  Home Living                                          Prior Functioning/Environment  Frequency Min 2X/week     Progress Toward Goals  OT Goals(current goals can now be found in the care plan section)  Progress towards OT goals: Progressing toward goals  Acute Rehab OT Goals Patient Stated Goal: To figure out what is wrong with my arm.  Plan Discharge plan needs to be updated    Co-evaluation                 End of Session Equipment Utilized During Treatment: Gait belt;Rolling walker   Activity Tolerance Patient tolerated treatment well   Patient Left in chair;with call bell/phone within reach   Nurse Communication  (ok to give pt a cup of coffee)        Time: 1013-1040 OT Time Calculation (min): 27 min  Charges: OT General Charges $OT Visit: 1 Procedure OT Treatments $Self Care/Home Management : 23-37 mins  Malka So 04/28/2014, 11:43 AM  [phone]

## 2014-04-28 NOTE — Progress Notes (Signed)
Physical Therapy Treatment Patient Details Name: Luke Pierce MRN: [id number] DOB: 27-Jun-1945 Today's Date: 04/28/2014    History of Present Illness Patient is a 68 yo male admitted 04/25/14 with Lt-sided weakness (UE prior to LE), BLE edema, and SOB.  PMH:  COPD, CHF, HTN, ETOH and tobacco abuse, cellulitis RLE.    PT Comments    On arrival, pt with [place] O2 off (removed for bath per pt) with resting SaO2 85% on room air. No improvement with sitting upright and deep breaths.  O2 resumed at 2L with SaO2 increasing to 87% after 3 minutes; incr to 3L O2 with incr to 91% (at rest, sitting). During ambulation, multiple standing rest periods with attempts to get a walking SaO2 measure without success (reads 74% with poor waveform and HR varying 33 to 64). Discussed possibility that he will need O2 at home and pt reports he still smokes. MD, please address whether you intend to put him on home O2 with the patient.   Balance was improved today, however scored only 14/24 on Dynamic Gait Index (indicating incr fall risk).     Follow Up Recommendations  Home health PT;Supervision for mobility/OOB     Equipment Recommendations  None recommended by PT    Recommendations for Other Services       Precautions / Restrictions Precautions Precautions: Fall Restrictions Weight Bearing Restrictions: No    Mobility  Bed Mobility Overal bed mobility: Modified Independent Bed Mobility: Supine to Sit     Supine to sit: Modified independent (Device/Increase time)        Transfers Overall transfer level: Needs assistance Equipment used: None Transfers: Sit to/from Stand Sit to Stand: Min guard         General transfer comment: no unsteadiness with transfer  Ambulation/Gait Ambulation/Gait assistance: Min guard Ambulation Distance (Feet): 200 Feet Assistive device: None Gait Pattern/deviations: Step-through pattern;Decreased stride length;Drifts right/left     General Gait Details:  slight drifting; no assist needed to recover   Stairs Stairs: Yes Stairs assistance: Min guard Stair Management: One rail Right;Alternating pattern;Forwards Number of Stairs: 5 General stair comments: steady  Wheelchair Mobility    Modified Rankin (Stroke Patients Only)       Balance             Standing balance-Leahy Scale: Good                      Cognition Arousal/Alertness: Awake/alert Behavior During Therapy: Flat affect Overall Cognitive Status: Within Functional Limits for tasks assessed                      Exercises      General Comments        Pertinent Vitals/Pain Pain Assessment: No/denies pain    Home Living                      Prior Function            PT Goals (current goals can now be found in the care plan section) Acute Rehab PT Goals Patient Stated Goal: To figure out what is wrong with my arm. Progress towards PT goals: Progressing toward goals    Frequency  Min 3X/week    PT Plan Current plan remains appropriate    Co-evaluation             End of Session Equipment Utilized During Treatment: Gait belt Activity Tolerance: Patient tolerated treatment well Patient left:  with call bell/phone within reach;in chair;with chair alarm set     Time: [phone] PT Time Calculation (min) (ACUTE ONLY): 27 min  Charges:  $Gait Training: 23-37 mins                    G Codes:      Luke Pierce 04-30-2014, 9:59 AM Pager [phone]

## 2014-04-28 NOTE — Progress Notes (Signed)
Physical Therapist notes for home 02 during her assessment  On arrival, pt with [place] O2 off (removed for bath per pt) with resting SaO2 85% on room air. No improvement with sitting upright and deep breaths. [place] O2 resumed at 2L with SaO2 increasing to 87% after 3 minutes; incr to 3L O2 with incr to 91% (at rest, sitting). During ambulation, multiple standing rest periods with attempts to get a walking SaO2 measure without success (reads 74% with poor waveform and HR varying 33 to 64). Discussed possibility that he will need O2 at home and pt reports he still smokes. MD, please address whether you intend to put him on home O2 with the patient.On arrival, pt with [place] O2 off (removed for bath per pt) with resting SaO2 85% on room air. No improvement with sitting upright and deep breaths.  O2 resumed at 2L with SaO2 increasing to 87% after 3 minutes; incr to 3L O2 with incr to 91% (at rest, sitting). During ambulation, multiple standing rest periods with attempts to get a walking SaO2 measure without success (reads 74% with poor waveform and HR varying 33 to 64). Discussed possibility that he will need O2 at home and pt reports he still smokes. MD, please address whether you intend to put him on home O2 with the patient.

## 2014-04-28 NOTE — Progress Notes (Signed)
Patient was discharged home with wife. IV removed, condom catheter removed, discharge instructions and medications were reviewed and discussed with the patient and his wife.

## 2014-04-28 NOTE — Discharge Instructions (Signed)
Acute Respiratory Failure °Respiratory failure is when your lungs are not working well and your breathing (respiratory) system fails. When respiratory failure occurs, it is difficult for your lungs to get enough oxygen, get rid of carbon dioxide, or both. Respiratory failure can be life threatening.  °Respiratory failure can be acute or chronic. Acute respiratory failure is sudden, severe, and requires emergency medical treatment. Chronic respiratory failure is less severe, happens over time, and requires ongoing treatment.  °WHAT ARE THE CAUSES OF ACUTE RESPIRATORY FAILURE?  °Any problem affecting the heart or lungs can cause acute respiratory failure. Some of these causes include the following: °· Chronic bronchitis and emphysema (COPD).   °· Blood clot going to a lung (pulmonary embolism).   °· Having water in the lungs caused by heart failure, lung injury, or infection (pulmonary edema).   °· Collapsed lung (pneumothorax).   °· Pneumonia.   °· Pulmonary fibrosis.   °· Obesity.   °· Asthma.   °· Heart failure.   °· Any type of trauma to the chest that can make breathing difficult.   °· Nerve or muscle diseases making chest movements difficult. °WHAT SYMPTOMS SHOULD YOU WATCH FOR?  °If you have any of these signs or symptoms, you should seek immediate medical care:  °· You have shortness of breath (dyspnea) with or without activity.   °· You have rapid, fast breathing (tachypnea).   °· You are wheezing. °· You are unable to say more than a few words without having to catch your breath. °· You find it very difficult to function normally. °· You have a fast heart rate.   °· You have a bluish color to your finger or toe nail beds.   °· You have confusion or drowsiness or both.   °HOW WILL MY ACUTE RESPIRATORY FAILURE BE TREATED?  °Treatment of acute respiratory failure depends on the cause of the respiratory failure. Usually, you will stay in the intensive care unit so your breathing can be watched closely. Treatment  can include the following: °· Oxygen. Oxygen can be delivered through the following: °· Nasal cannula. This is small tubing that goes in your nose to give you oxygen. °· Face mask. A face mask covers your nose and mouth to give you oxygen. °· Medicine. Different medicines can be given to help with breathing. These can include: °· Nebulizers. Nebulizers deliver medicines to open the air passages (bronchodilators). These medicines help to open or relax the airways in the lungs so you can breathe better. They can also help loosen mucus from your lungs. °· Diuretics. Diuretic medicines can help you breathe better by getting rid of extra water in your body. °· Steroids. Steroid medicines can help decrease swelling (inflammation) in your lungs. °· Antibiotics. °· Chest tube. If you have a collapsed lung (pneumothorax), a chest tube is placed to help reinflate the lung. °· Non-invasive positive pressure ventilation (NPPV). This is a tight-fitting mask that goes over your nose and mouth. The mask has tubing that is attached to a machine. The machine blows air into the tubing, which helps to keep the tiny air sacs (alveoli) in your lungs open. This machine allows you to breathe on your own. °· Ventilator. A ventilator is a breathing machine. When on a ventilator, a breathing tube is put into the lungs. A ventilator is used when you can no longer breathe well enough on your own. You may have low oxygen levels or high carbon dioxide (CO2) levels in your blood. When you are on a ventilator, sedation and pain medicines are given to make you sleep   so your lungs can heal. Document Released: 05/13/2013 Document Revised: 09/22/2013 Document Reviewed: 05/13/2013 [facility] Patient Information 2015 [place], [place]. This information is not intended to replace advice given to you by your health care provider. Make sure you discuss any questions you have with your health care provider.  Oxygen Use at Home Oxygen can be prescribed for  home use. The prescription will show the flow rate. This is how much oxygen is to be used per minute. This will be listed in liters per minute (LPM or L/M). A liter is a metric measurement of volume. You will use oxygen therapy as directed. It can be used while exercising, sleeping, or at rest. You may need oxygen continuously. Your health care provider may order a blood oxygen test (arterial blood gas or pulse oximetry test) that will show what your oxygen level is. Your health care provider will use these measurements to learn about your needs and follow your progress. Home oxygen therapy is commonly used on patients with various lung (pulmonary) related conditions. Some of these conditions include:  Asthma.  Lung cancer.  Pneumonia.  Emphysema.  Chronic bronchitis.  Cystic fibrosis.  Other lung diseases.  Pulmonary fibrosis.  Occupational lung disease.  Heart failure.  Chronic obstructive pulmonary disease (COPD). 3 COMMON WAYS OF PROVIDING OXYGEN THERAPY  Gas: The gas form of oxygen is put into variously sized cylinders or tanks. The cylinders or oxygen tanks contain compressed oxygen. The cylinder is equipped with a regulator that controls the flow rate. Because the flow of oxygen out of the cylinder is constant, an oxygen conserving device may be attached to the system to avoid waste. This device releases the gas only when you inhale and cuts it off when you exhale. Oxygen can be provided in a small cylinder that can be carried with you. Large tanks are heavy and are only for stationary use. After use, empty tanks must be exchanged for full tanks.  Liquid: The liquid form of oxygen is put into a container similar to a thermos. When released, the liquid converts to a gas and you breathe it in just like the compressed gas. This storage method takes up less space than the compressed gas cylinder, and you can transfer the liquid to a small, portable vessel at home. Liquid oxygen is  more expensive than the compressed gas, and the vessel vents when not in use. An oxygen conserving device may be built into the vessel to conserve the oxygen. Liquid oxygen is very cold, around 297 below zero.  Oxygen concentrator: This medical device filters oxygen from room air and gives almost 100% oxygen to the patient. Oxygen concentrators are powered by electricity. Benefits of this system are:  It does not need to be resupplied.  It is not as costly as liquid oxygen.  Extra tubing permits the user to move around easier. There are several types of small, portable oxygen systems available which can help you remain active and mobile. You must have a cylinder of oxygen as a backup in the event of a power failure. Advise your electric power company that you are on oxygen therapy in order to get priority service when there is a power failure. OXYGEN DELIVERY DEVICES There are 3 common ways to deliver oxygen to your body.  Nasal cannula. This is a 2-pronged device inserted in the nostrils that is connected to tubing carrying the oxygen. The tubing can rest on the ears or be attached to the frame of eyeglasses.  Mask. People  who need a high flow of oxygen generally use a mask.  Transtracheal catheter. Transtracheal oxygen therapy requires the insertion of a small, flexible tube (catheter) in the windpipe (trachea). This catheter is held in place by a necklace. Since transtracheal oxygen bypasses the mouth, nose, and throat, a humidifier is absolutely required at flow rates of 1 LPM or greater. OXYGEN USE SAFETY TIPS  Never smoke while using oxygen. Oxygen does not burn or explode, but flammable materials will burn faster in the presence of oxygen.  Keep a Data processing manager close by. Let your fire department know that you have oxygen in your home.  Warn visitors not to smoke near you when you are using oxygen. Put up "no smoking" signs in your home where you most often use the oxygen.  When  you go to a restaurant with your portable oxygen source, ask to be seated in the nonsmoking section.  Stay at least 5 feet away from gas stoves, candles, lighted fireplaces, or other heat sources.  Do not use materials that burn easily (flammable) while using your oxygen.  If you use an oxygen cylinder, make sure it is secured to some fixed object or in a stand. If you use liquid oxygen, make sure the vessel is kept upright to keep the oxygen from pouring out. Liquid oxygen is so cold it can hurt your skin.  If you use an oxygen concentrator, call your electric company so you will be given priority service if your power goes out. Avoid using extension cords, if possible.  Regularly test your smoke detectors at home to make sure they work. If you receive care in your home from a nurse or other health care provider, he or she may also check to make sure your smoke detectors work. GUIDELINES FOR CLEANING YOUR EQUIPMENT  Wash the nasal prongs with a liquid soap. Thoroughly rinse them once or twice a week.  Replace the prongs every 2 to 4 weeks. If you have an infection (cold, pneumonia) change them when you are well.  Your health care provider will give you instructions on how to clean your transtracheal catheter.  The humidifier bottle should be washed with soap and warm water and rinsed thoroughly between each refill. Air-dry the bottle before filling it with sterile or distilled water. The bottle and its top should be disinfected after they are cleaned.  If you use an oxygen concentrator, unplug the unit. Then wipe down the cabinet with a damp cloth and dry it daily. The air filter should be cleaned at least twice a week.  Follow your home medical equipment and service company's directions for cleaning the compressor filter. HOME CARE INSTRUCTIONS   Do not change the flow of oxygen unless directed by your health care provider.  Do not use alcohol or other sedating drugs unless instructed.  They slow your breathing rate.  Do not use materials that burn easily (flammable) while using your oxygen.  Always keep a spare tank of oxygen. Plan ahead for holidays when you may not be able to get a prescription filled.  Use water-based lubricants on your lips or nostrils. Do not use an oil-based product like petroleum jelly.  To prevent your cheeks or the skin behind your ears from becoming irritated, tuck some gauze under the tubing.  If you have persistent redness under your nose, call your health care provider.  When you no longer need oxygen, your doctor will have the oxygen discontinued. Oxygen is not addicting or habit forming.  Use the oxygen as instructed. Too much oxygen can be harmful and too little will not give you the benefit you need.  Shortness of breath is not always from a lack of oxygen. If your oxygen level is not the cause of your shortness of breath, taking oxygen will not help. SEEK MEDICAL CARE IF:   You have frequent headaches.  You have shortness of breath or a lasting cough.  You have anxiety.  You are confused.  You are drowsy or sleepy all the time.  You develop an illness which aggravates your breathing.  You cannot exercise.  You are restless.  You have blue lips or fingernails.  You have difficult or irregular breathing and it is getting worse.  You have a fever. Document Released: 07/29/2003 Document Revised: 09/22/2013 Document Reviewed: 12/18/2012 [facility] Patient Information 2015 [place], [place]. This information is not intended to replace advice given to you by your health care provider. Make sure you discuss any questions you have with your health care provider.  Ischemic Stroke A stroke (cerebrovascular accident) is the sudden death of brain tissue. It is a medical emergency. A stroke can cause permanent loss of brain function. This can cause problems with different parts of your body. A transient ischemic attack (TIA) is different  because it does not cause permanent damage. A TIA is a short-lived problem of poor blood flow affecting a part of the brain. A TIA is also a serious problem because having a TIA greatly increases the chances of having a stroke. When symptoms first develop, you cannot know if the problem might be a stroke or a TIA. CAUSES  A stroke is caused by a decrease of oxygen supply to an area of your brain. It is usually the result of a small blood clot or collection of cholesterol or fat (plaque) that blocks blood flow in the brain. A stroke can also be caused by blocked or damaged carotid arteries.  RISK FACTORS  High blood pressure (hypertension).  High cholesterol.  Diabetes mellitus.  Heart disease.  The buildup of plaque in the blood vessels (peripheral artery disease or atherosclerosis).  The buildup of plaque in the blood vessels providing blood and oxygen to the brain (carotid artery stenosis).  An abnormal heart rhythm (atrial fibrillation).  Obesity.  Smoking.  Taking oral contraceptives (especially in combination with smoking).  Physical inactivity.  A diet high in fats, salt (sodium), and calories.  Alcohol use.  Use of illegal drugs (especially cocaine and methamphetamine).  Being African American.  Being over the age of 51.  Family history of stroke.  Previous history of blood clots, stroke, TIA, or heart attack.  Sickle cell disease. SYMPTOMS  These symptoms usually develop suddenly, or may be newly present upon awakening from sleep:  Sudden weakness or numbness of the face, arm, or leg, especially on one side of the body.  Sudden trouble walking or difficulty moving arms or legs.  Sudden confusion.  Sudden personality changes.  Trouble speaking (aphasia) or understanding.  Difficulty swallowing.  Sudden trouble seeing in one or both eyes.  Double vision.  Dizziness.  Loss of balance or coordination.  Sudden severe headache with no known  cause.  Trouble reading or writing. DIAGNOSIS  Your health care provider can often determine the presence or absence of a stroke based on your symptoms, history, and physical exam. Computed tomography (CT) of the brain is usually performed to confirm the stroke, determine causes, and determine stroke severity. Other tests may be  done to find the cause of the stroke. These tests may include:  Electrocardiography.  Continuous heart monitoring.  Echocardiography.  Carotid ultrasonography.  Magnetic resonance imaging (MRI).  A scan of the brain circulation.  Blood tests. PREVENTION  The risk of a stroke can be decreased by appropriately treating high blood pressure, high cholesterol, diabetes, heart disease, and obesity and by quitting smoking, limiting alcohol, and staying physically active. TREATMENT  Time is of the essence. It is important to seek treatment at the first sign of these symptoms because you may receive a medicine to dissolve the clot (thrombolytic) that cannot be given if too much time has passed since your symptoms began. Even if you do not know when your symptoms began, get treatment as soon as possible as there are other treatment options available including oxygen, intravenous (IV) fluids, and medicines to thin the blood (anticoagulants). Treatment of stroke depends on the duration, severity, and cause of your symptoms. Medicines and dietary changes may be used to address diabetes, high blood pressure, and other risk factors. Physical, speech, and occupational therapists will assess you and work with you to improve any functions impaired by the stroke. Measures will be taken to prevent short-term and long-term complications, including infection from breathing foreign material into the lungs (aspiration pneumonia), blood clots in the legs, bedsores, and falls. Rarely, surgery may be needed to remove large blood clots or to open up blocked arteries. HOME CARE INSTRUCTIONS    Take medicines only as directed by your health care provider. Follow the directions carefully. Medicines may be used to control risk factors for a stroke. Be sure you understand all your medicine instructions.  You may be told to take a medicine to thin the blood, such as aspirin or the anticoagulant warfarin. Warfarin needs to be taken exactly as instructed.  Too much and too little warfarin are both dangerous. Too much warfarin increases the risk of bleeding. Too little warfarin continues to allow the risk for blood clots. While taking warfarin, you will need to have regular blood tests to measure your blood clotting time. These blood tests usually include both the PT and INR tests. The PT and INR results allow your health care provider to adjust your dose of warfarin. The dose can change for many reasons. It is critically important that you take warfarin exactly as prescribed, and that you have your PT and INR levels drawn exactly as directed.  Many foods, especially foods high in vitamin K, can interfere with warfarin and affect the PT and INR results. Foods high in vitamin K include spinach, kale, broccoli, cabbage, collard and turnip greens, brussels sprouts, peas, cauliflower, seaweed, and parsley, as well as beef and pork liver, green tea, and soybean oil. You should eat a consistent amount of foods high in vitamin K. Avoid major changes in your diet, or notify your health care provider before changing your diet. Arrange a visit with a dietitian to answer your questions.  Many medicines can interfere with warfarin and affect the PT and INR results. You must tell your health care provider about any and all medicines you take. This includes all vitamins and supplements. Be especially cautious with aspirin and anti-inflammatory medicines. Do not take or discontinue any prescribed or over-the-counter medicine except on the advice of your health care provider or pharmacist.  Warfarin can have side  effects, such as excessive bruising or bleeding. You will need to hold pressure over cuts for longer than usual. Your health care provider  or pharmacist will discuss other potential side effects.  Avoid sports or activities that may cause injury or bleeding.  Be mindful when shaving, flossing your teeth, or handling sharp objects.  Alcohol can change the body's ability to handle warfarin. It is best to avoid alcoholic drinks or consume only very small amounts while taking warfarin. Notify your health care provider if you change your alcohol intake.  Notify your dentist or other health care providers before procedures.  If swallow studies have determined that your swallowing reflex is present, you should eat healthy foods. Including 5 or more servings of fruits and vegetables a day may reduce the risk of stroke. Foods may need to be a certain consistency (soft or pureed), or small bites may need to be taken in order to avoid aspirating or choking. Certain dietary changes may be advised to address high blood pressure, high cholesterol, diabetes, or obesity.  Food choices that are low in sodium, saturated fat, trans fat, and cholesterol are recommended to manage high blood pressure.  Food choies that are high in fiber, and low in saturated fat, trans fat, and cholesterol may control cholesterol levels.  Controlling carbohydrates and sugar intake is recommended to manage diabetes.  Reducing calorie intake and making food choices that are low in sodium, saturated fat, trans fat, and cholesterol are recommended to manage obesity.  Maintain a healthy weight.  Stay physically active. It is recommended that you get at least 30 minutes of activity on all or most days.  Do not use any tobacco products including cigarettes, chewing tobacco, or electronic cigarettes.  Limit alcohol use even if you are not taking warfarin. Moderate alcohol use is considered to be:  No more than 2 drinks each day for  men.  No more than 1 drink each day for nonpregnant women.  Home safety. A safe home environment is important to reduce the risk of falls. Your health care provider may arrange for specialists to evaluate your home. Having grab bars in the bedroom and bathroom is often important. Your health care provider may arrange for equipment to be used at home, such as raised toilets and a seat for the shower.  Physical, occupational, and speech therapy. Ongoing therapy may be needed to maximize your recovery after a stroke. If you have been advised to use a walker or a cane, use it at all times. Be sure to keep your therapy appointments.  Follow all instructions for follow-up with your health care provider. This is very important. This includes any referrals, physical therapy, rehabilitation, and lab tests. Proper follow-up can prevent another stroke from occurring. SEEK MEDICAL CARE IF:  You have personality changes.  You have difficulty swallowing.  You are seeing double.  You have dizziness.  You have a fever.  You have skin breakdown. SEEK IMMEDIATE MEDICAL CARE IF:  Any of these symptoms may represent a serious problem that is an emergency. Do not wait to see if the symptoms will go away. Get medical help right away. Call your local emergency services (911 in [place] Do not drive yourself to the hospital.  You have sudden weakness or numbness of the face, arm, or leg, especially on one side of the body.  You have sudden trouble walking or difficulty moving arms or legs.  You have sudden confusion.  You have trouble speaking (aphasia) or understanding.  You have sudden trouble seeing in one or both eyes.  You have a loss of balance or coordination.  You have a  sudden, severe headache with no known cause.  You have new chest pain or an irregular heartbeat.  You have a partial or total loss of consciousness. Document Released: 05/08/2005 Document Revised: 09/22/2013 Document  Reviewed: 12/17/2011 [facility] Patient Information 2015 [place], [place]. This information is not intended to replace advice given to you by your health care provider. Make sure you discuss any questions you have with your health care provider.

## 2014-04-28 NOTE — Progress Notes (Signed)
SATURATION QUALIFICATIONS: (This note is used to comply with regulatory documentation for home oxygen)  Patient Saturations on Room Air at Rest = 81%  Patient Saturations on Room Air while Ambulating = 79%  Patient Saturations on 3 Liters of oxygen while Ambulating = 79%  Please briefly explain why patient needs home oxygen: Desats with ambulation without oxygen. It is necessary at rest and activity.

## 2014-04-28 NOTE — Progress Notes (Signed)
Talked to patient again about the importance of HHPT; patient does not want any therapy at this time; CM informed the patient that if he changed his mind about therapy and wants it after discharge to call his PCP for arrangements; Patient stated " how do I know if I need therapy," CM informed the patient that if he thinks that his balance is off, need strength building/ exercises to improve your gait; patient stated, " I have that now." CM attempted to talk to patient again about the importance of [place] but again he declined; Long discussion with patient as to not to smoke around oxygen; Mindi Slicker RN,BSN,MHA [phone]

## 2014-05-02 LAB — CULTURE, BLOOD (ROUTINE X 2)
CULTURE: NO GROWTH
Culture: NO GROWTH

## 2014-05-05 ENCOUNTER — Ambulatory Visit (INDEPENDENT_AMBULATORY_CARE_PROVIDER_SITE_OTHER): Payer: Medicare Other | Admitting: Family Medicine

## 2014-05-05 ENCOUNTER — Ambulatory Visit (INDEPENDENT_AMBULATORY_CARE_PROVIDER_SITE_OTHER): Payer: Medicare Other

## 2014-05-05 VITALS — BP 144/76 | HR 78 | Temp 98.5°F | Resp 20 | Ht 71.0 in | Wt 161.2 lb

## 2014-05-05 DIAGNOSIS — E871 Hypo-osmolality and hyponatremia: Secondary | ICD-10-CM

## 2014-05-05 DIAGNOSIS — R0902 Hypoxemia: Secondary | ICD-10-CM

## 2014-05-05 DIAGNOSIS — J189 Pneumonia, unspecified organism: Secondary | ICD-10-CM

## 2014-05-05 DIAGNOSIS — R609 Edema, unspecified: Secondary | ICD-10-CM

## 2014-05-05 DIAGNOSIS — J449 Chronic obstructive pulmonary disease, unspecified: Secondary | ICD-10-CM

## 2014-05-05 LAB — POCT CBC
GRANULOCYTE PERCENT: 79.7 % (ref 37–80)
HCT, POC: 52.7 % (ref 43.5–53.7)
Hemoglobin: 16.7 g/dL (ref 14.1–18.1)
Lymph, poc: 1.2 (ref 0.6–3.4)
MCH, POC: 30.2 pg (ref 27–31.2)
MCHC: 31.8 g/dL (ref 31.8–35.4)
MCV: 94.9 fL (ref 80–97)
MID (CBC): 0.6 (ref 0–0.9)
MPV: 6 fL (ref 0–99.8)
PLATELET COUNT, POC: 148 10*3/uL (ref 142–424)
POC GRANULOCYTE: 6.9 (ref 2–6.9)
POC LYMPH %: 13.3 % (ref 10–50)
POC MID %: 7 % (ref 0–12)
RBC: 5.55 M/uL (ref 4.69–6.13)
RDW, POC: 16 %
WBC: 8.7 10*3/uL (ref 4.6–10.2)

## 2014-05-05 LAB — COMPREHENSIVE METABOLIC PANEL
ALBUMIN: 3.7 g/dL (ref 3.5–5.2)
ALT: 33 U/L (ref 0–53)
AST: 30 U/L (ref 0–37)
Alkaline Phosphatase: 37 U/L — ABNORMAL LOW (ref 39–117)
BUN: 19 mg/dL (ref 6–23)
CALCIUM: 9 mg/dL (ref 8.4–10.5)
CHLORIDE: 82 meq/L — AB (ref 96–112)
CO2: 49 mEq/L — ABNORMAL HIGH (ref 19–32)
Creat: 0.67 mg/dL (ref 0.50–1.35)
Glucose, Bld: 78 mg/dL (ref 70–99)
POTASSIUM: 4 meq/L (ref 3.5–5.3)
SODIUM: 134 meq/L — AB (ref 135–145)
TOTAL PROTEIN: 5.6 g/dL — AB (ref 6.0–8.3)
Total Bilirubin: 1.5 mg/dL — ABNORMAL HIGH (ref 0.2–1.2)

## 2014-05-05 LAB — BRAIN NATRIURETIC PEPTIDE: Brain Natriuretic Peptide: 221.9 pg/mL — ABNORMAL HIGH (ref 0.0–100.0)

## 2014-05-05 MED ORDER — FLUTICASONE-SALMETEROL 230-21 MCG/ACT IN AERO: 2.0000 | INHALATION_SPRAY | Freq: Two times a day (BID) | RESPIRATORY_TRACT | Status: DC

## 2014-05-05 NOTE — Patient Instructions (Signed)
Continue current medications  Stop smoking  Practice deep breathing  When seated try to keep legs elevated.  Get some regular exercise  Return sooner if problems arise, otherwise plan to see me in mid-January. Close to that time call and find out what my office days will be.

## 2014-05-05 NOTE — Progress Notes (Signed)
Subjective: Patient was hospitalized 10 days ago with a problem with shortness of breath and left-sided weakness.His discharge diagnoses included the following list. Discharge Diagnoses:  Left upper extremity weakness Low extremity swelling Community-acquired pneumonia Right lower injury cellulitis Acute kidney injury Hypertension COPD with mild exacerbation Tobacco Abuse Elevated i-STAT troponin Polycythemia  He was given the following instructions Recommendations for Outpatient Follow-up:  Patient will be discharged to home with home health physical therapy. Patient will need to follow-up with his primary care physician within one week of discharge. Patient to continue taking his medications as prescribed. Patient may need to follow-up with neurosurgery as needed. Patient will be discharged with home oxygen. Patient to follow a heart healthy diet with 1500 mL fluid obstruction per day. Patient will need to have a repeat BMP within 1 week of discharge.  The hospital course included:  Hospital Course:  Left upper ext weakness -Patient only has continued weakness in the LUE -CT head: No acute intracranial pathology -MRI brain: Mild atrophy and small vessel disease, no acute intracranial findings -Pending echocardiogram -Carotid Doppler: 1-39% ICA stenosis, vertebral artery flow is antegrade -Lipid panel: TC 94, TG 46, HDL 56, LDL 29 -Hemoglobin A1c 5.7 -PT and OT consulted- recommended home health -Neurology consultation appreciated and recommended cervical and thoracic MRI -Continue aspirin (of note, patient states that he was not compliant with his daily aspirin regimen.) -Cervical and Thoracic MRI: 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  Community-acquired pneumonia -CXR: Left lower lobe airspace disease, atelectasis versus chronic versus pneumonia -Patient placed on vancomycin and cefepime, avoided quinolones due to prolonged QTC -Strep pneumonia and legionella urine antigens negative -Blood cultures negative to date -Will discharge patient with high dose Augmentin  Right lower extremity cellulitis -Resolved, was placed on vancomycin  Hyponatremia -Likely secondary to volume overload as patient does appear to be hypovolemic -Continue diuresis with strict fluid restriction -Na134  Acute kidney injury -Resolved  Hypertension -Allowing for permissive hypertension, ACE inhibitor, amlodipine, but may resume at discharge -Continue Lasix  -Discontinued HCTZ  COPD with mild exacerbation -Continue duonebs as necessary -Continue antitussives, prednisone, supplemental oxygen, Dulera -Complicated by continued smoking  Tobacco Abuse -Smoking cessation counseling given -Continue nicotine patch  Elevated I-Stat Troponin -Troponins have been negative, patient denies any chest pain at this time  Polycythemia -Patient does have history of smoking and COPD  He is in here today for a follow-up visit. He is feeling better. He is swelling a little less. He breathes better on the oxygen and ambulates more on it. He is not having a lot of pain. He was told he had some cellulitis on his legs but they're not read.  Objective: No major acute distress. Wearing oxygen. His neck is supple without JVD. Chest has a few soft wheezes. Heart regular without murmur. Abdomen has ecchymosis from the anticoagulant injections. Soft without masses  or tenderness. Extremities have 3+ pitting edema. No evidence of cellulitis.  Assessment: Status post pneumonia COPD Hypoxemia History of polycythemia History of tobacco abuse Hyponatremia  Plan: Check labs again and chest  x-ray.  [facility] reading (PRIMARY) by  Dr. Linna Darner  Improved lll infiltrate.   Results for orders placed or performed in visit on 05/05/14  POCT CBC  Result Value Ref Range   WBC 8.7 4.6 - 10.2 K/uL   Lymph, poc 1.2 0.6 - 3.4   POC LYMPH PERCENT 13.3 10 - 50 %L   MID (cbc) 0.6 0 - 0.9   POC MID % 7.0 0 - 12 %M   POC Granulocyte 6.9 2 - 6.9   Granulocyte percent 79.7 37 - 80 %G   RBC 5.55 4.69 - 6.13 M/uL   Hemoglobin 16.7 14.1 - 18.1 g/dL   HCT, POC 52.7 43.5 - 53.7 %   MCV 94.9 80 - 97 fL   MCH, POC 30.2 27 - 31.2 pg   MCHC 31.8 31.8 - 35.4 g/dL   RDW, POC 16.0 %   Platelet Count, POC 148 142 - 424 K/uL   MPV 6.0 0 - 99.8 fL   Continue current medications. I will see the patient back in one month. Stressed strongly the need to quit smoking.

## 2014-05-05 NOTE — Progress Notes (Deleted)
   Subjective:    Patient ID: Luke Pierce, male    DOB: 17-Jul-1945, 68 y.o.   MRN: [id number]  HPI    Review of Systems     Objective:   Physical Exam        Assessment & Plan:

## 2014-05-06 ENCOUNTER — Emergency Department (HOSPITAL_COMMUNITY)
Admission: EM | Admit: 2014-05-06 | Discharge: 2014-05-06 | Disposition: A | Discharge status: Home / Self Care | Payer: Medicare Other | Attending: Emergency Medicine | Admitting: Emergency Medicine

## 2014-05-06 ENCOUNTER — Encounter (HOSPITAL_COMMUNITY): Payer: Self-pay | Admitting: Emergency Medicine

## 2014-05-06 DIAGNOSIS — Z79899 Other long term (current) drug therapy: Secondary | ICD-10-CM | POA: Diagnosis not present

## 2014-05-06 DIAGNOSIS — R600 Localized edema: Secondary | ICD-10-CM | POA: Insufficient documentation

## 2014-05-06 DIAGNOSIS — Z87891 Personal history of nicotine dependence: Secondary | ICD-10-CM | POA: Insufficient documentation

## 2014-05-06 DIAGNOSIS — I509 Heart failure, unspecified: Secondary | ICD-10-CM | POA: Insufficient documentation

## 2014-05-06 DIAGNOSIS — Z7951 Long term (current) use of inhaled steroids: Secondary | ICD-10-CM | POA: Diagnosis not present

## 2014-05-06 DIAGNOSIS — I10 Essential (primary) hypertension: Secondary | ICD-10-CM | POA: Insufficient documentation

## 2014-05-06 DIAGNOSIS — Z7982 Long term (current) use of aspirin: Secondary | ICD-10-CM | POA: Diagnosis not present

## 2014-05-06 DIAGNOSIS — E872 Acidosis, unspecified: Secondary | ICD-10-CM

## 2014-05-06 DIAGNOSIS — J441 Chronic obstructive pulmonary disease with (acute) exacerbation: Secondary | ICD-10-CM | POA: Insufficient documentation

## 2014-05-06 DIAGNOSIS — R7989 Other specified abnormal findings of blood chemistry: Secondary | ICD-10-CM | POA: Diagnosis present

## 2014-05-06 LAB — BASIC METABOLIC PANEL
Anion gap: 7 (ref 5–15)
BUN: 23 mg/dL (ref 6–23)
CHLORIDE: 82 meq/L — AB (ref 96–112)
CO2: 38 mEq/L — ABNORMAL HIGH (ref 19–32)
Calcium: 8.8 mg/dL (ref 8.4–10.5)
Creatinine, Ser: 0.67 mg/dL (ref 0.50–1.35)
GFR calc Af Amer: >90 mL/min (ref >90)
GFR calc non Af Amer: >90 mL/min (ref >90)
GLUCOSE: 140 mg/dL — AB (ref 70–99)
POTASSIUM: 3.5 meq/L — AB (ref 3.7–5.3)
Sodium: 127 mEq/L — ABNORMAL LOW (ref 137–147)

## 2014-05-06 LAB — BLOOD GAS, ARTERIAL
ACID-BASE EXCESS: 13 mmol/L — AB (ref 0.0–2.0)
BICARBONATE: 38.8 meq/L — AB (ref 20.0–24.0)
Drawn by: 331471
O2 Content: 3.5 L/min
O2 SAT: 96.8 %
PCO2 ART: 51 mmHg — AB (ref 35.0–45.0)
PO2 ART: 80.8 mmHg (ref 80.0–100.0)
Patient temperature: 98.6
TCO2: 32.3 mmol/L (ref 0–100)
pH, Arterial: 7.494 — ABNORMAL HIGH (ref 7.350–7.450)

## 2014-05-06 MED ORDER — ACETAZOLAMIDE SODIUM 500 MG IJ SOLR
250.0000 mg | Freq: Once | INTRAMUSCULAR | Status: AC
  Administered 2014-05-06: 250 mg via INTRAVENOUS
  Filled 2014-05-06: qty 500

## 2014-05-06 MED ORDER — POTASSIUM CHLORIDE ER 10 MEQ PO TBCR: 10.0000 meq | EXTENDED_RELEASE_TABLET | Freq: Two times a day (BID) | ORAL | Status: DC

## 2014-05-06 NOTE — ED Provider Notes (Signed)
CSN: [id number]     Arrival date & time 05/06/14  1044 History   First MD Initiated Contact with Patient 05/06/14 1047     Chief Complaint  Patient presents with  . Abnormal Lab    CO2 of 45     (Consider location/radiation/quality/duration/timing/severity/associated sxs/prior Treatment) The history is provided by the patient.   patient has a history of COPD and recent admission for pneumonia. Discharge around a week ago. He is now on home oxygen but he was not on prior to the admission. He had a follow-up visit yesterday with his PCP. He states he has not had worsening of his breathing since he left the hospital. He states his CO2 was elevated and was told to come in the ER. Patient states that he got the feeling that he could die in his sleep if he did not come in. His cough is unchanged. No increased swelling in his legs. He states he thought he was doing well.  Past Medical History  Diagnosis Date  . COPD (chronic obstructive pulmonary disease)   . Hypertension   . EtOH dependence   . Tobacco dependence   . CHF (congestive heart failure)    History reviewed. No pertinent past surgical history. No family history on file. History  Substance Use Topics  . Smoking status: Former Smoker -- 1.00 packs/day for 45 years    Types: Cigarettes    Quit date: 08/25/2013  . Smokeless tobacco: Never Used  . Alcohol Use: 6.0 oz/week    10 Cans of beer per week    Review of Systems  Constitutional: Negative for activity change and appetite change.  Eyes: Negative for pain.  Respiratory: Positive for cough and shortness of breath. Negative for chest tightness.   Cardiovascular: Positive for leg swelling. Negative for chest pain.  Gastrointestinal: Negative for nausea, vomiting, abdominal pain and diarrhea.  Genitourinary: Negative for flank pain.  Musculoskeletal: Negative for back pain and neck stiffness.  Skin: Negative for rash.  Neurological: Negative for numbness and headaches.       Allergies  Review of patient's allergies indicates no known allergies.  Home Medications   Prior to Admission medications   Medication Sig Start Date End Date Taking? Authorizing Provider  albuterol (PROAIR HFA) 108 (90 BASE) MCG/ACT inhaler Inhale 2 puffs into the lungs every 6 (six) hours as needed for wheezing or shortness of breath.   Yes Historical Provider, MD  amLODipine (NORVASC) 5 MG tablet Take 1 tablet (5 mg total) by mouth daily. 04/28/14  Yes Maryann Mikhail, DO  amoxicillin-clavulanate (AUGMENTIN) 875-125 MG per tablet Take 1 tablet by mouth 2 (two) times daily. 04/28/14  Yes Maryann Mikhail, DO  aspirin 325 MG tablet Take 1 tablet (325 mg total) by mouth daily. 04/28/14  Yes Maryann Mikhail, DO  benazepril (LOTENSIN) 40 MG tablet Take 40 mg by mouth daily.   Yes Historical Provider, MD  fluticasone-salmeterol (ADVAIR HFA) 230-21 MCG/ACT inhaler Inhale 2 puffs into the lungs 2 (two) times daily. 05/05/14  Yes Posey Boyer, MD  guaiFENesin (MUCINEX) 600 MG 12 hr tablet Take 1 tablet (600 mg total) by mouth 2 (two) times daily. 04/28/14  Yes Maryann Mikhail, DO  nicotine (NICODERM CQ - DOSED IN MG/24 HOURS) 14 mg/24hr patch Place 1 patch (14 mg total) onto the skin daily. 04/28/14  Yes Maryann Mikhail, DO  predniSONE (STERAPRED UNI-PAK) 10 MG tablet Prednisone dosing: Take  Prednisone 40mg  (4 tabs) x 3 days, then taper to 30mg  (3 tabs)  x 3 days, then 20mg  (2 tabs) x 3days, then 10mg  (1 tab) x 3days, then OFF. 04/28/14  Yes Maryann Mikhail, DO  benazepril (LOTENSIN) 40 MG tablet TAKE 1 TABLET (40 MG TOTAL) BY MOUTH DAILY. Patient not taking: Reported on 05/06/2014    Collene Leyden, PA-C  potassium chloride (K-DUR) 10 MEQ tablet Take 1 tablet (10 mEq total) by mouth 2 (two) times daily. 05/06/14   Jasper Riling. Pattiann Solanki, MD   BP 135/80 mmHg  Pulse 73  Temp(Src) 98.5 F (36.9 C) (Oral)  SpO2 100% Physical Exam  Constitutional: He appears well-developed.  HENT:  Head:  Normocephalic.  Eyes: Pupils are equal, round, and reactive to light.  Neck: No JVD present.  Cardiovascular: Normal rate.   Pulmonary/Chest:  Slightly harsh breath sounds diffusely.  Abdominal: Soft.  Musculoskeletal: Normal range of motion. He exhibits edema.  Mild bilateral lower extremity pitting edema.  Neurological: He is alert.  Skin: Skin is warm.  Nursing note and vitals reviewed.   ED Course  Procedures (including critical care time) Labs Review Labs Reviewed  BLOOD GAS, ARTERIAL - Abnormal; Notable for the following:    pH, Arterial 7.494 (*)    pCO2 arterial 51.0 (*)    Bicarbonate 38.8 (*)    Acid-Base Excess 13.0 (*)    All other components within normal limits  BASIC METABOLIC PANEL - Abnormal; Notable for the following:    Sodium 127 (*)    Potassium 3.5 (*)    Chloride 82 (*)    CO2 38 (*)    Glucose, Bld 140 (*)    All other components within normal limits    Imaging Review Dg Chest 2 View  05/05/2014   CLINICAL DATA:  Patient with prior left lower lobe pneumonia.  EXAM: CHEST  2 VIEW  COMPARISON:  08/04/2013, 04/25/2014  FINDINGS: Lungs are hyperinflated. Stable cardiac and mediastinal contours. Persistent minimal heterogeneous opacities left lung base. Scattered parenchymal scarring. Persistent probable small left pleural effusion versus thickening. No definite pneumothorax.  IMPRESSION: Persistent minimal heterogeneous opacities left lung base which may represent continued resolving infectious process, atelectasis and or scarring. Continued attention on radiographic followup is recommended to ensure resolution. If this continues to persist, chest CT may be warranted.   Electronically Signed   By: Lovey Newcomer M.D.   On: 05/05/2014 09:32     EKG Interpretation None      MDM   Final diagnoses:  Metabolic acidosis    Patient with elevated bicarbonate from PCP. Recent admission for pneumonia. New oxygen requirement at that time but has been stable  since. Discuss with pulmonology, Dr Jamal Collin. Recommended holding on the Lasix hydrochlorothiazide and give dose of Acetazolamide here. Will follow-up with his PCP in 2 days. Also given instructions for follow-up with pulmonology. Patient's pulmonary status is been relatively stable since discharge.    Jasper Riling. Alvino Chapel, MD 05/06/14 1651

## 2014-05-06 NOTE — ED Notes (Addendum)
Per EMS: pt had follow up with PCP after admissions for SOB, pt states had labs drawned and called today with CO2 of 45. Pt denies any worsening of SOB, has COPD. 20 g right forearm arm. Denies pain.

## 2014-05-06 NOTE — Discharge Instructions (Signed)
Metabolic Acidosis Metabolic acidosis happens when the blood becomes too acidic. It can happen in infants, children, and adults for many different reasons. Metabolic acidosis may range from mild to severe, or even life-threatening. CAUSES  Metabolic acidosis happens as a result of one of the following:  The body produces too much acid.  You consume a toxic substance that increases the acid content in the blood.  The body loses an important element, such as bicarbonate, which helps to balance acid.  The kidneys do not remove enough acid from the body. There are many causes. Metabolic acidosis can be brought on by chronic or life-threatening conditions, such as:  Diabetes.  Kidney disease or failure.  Liver failure.  Seizures.  Diarrhea.  Gastrointestinal conditions (or gastrointestinal surgery).  Severe dehydration.  Cancer.  Shock.  Severe infections that affect the whole body (systemic).  Anemia.  Heart failure.  Exposure or ingestion of poisons or toxic substances, such as:  Carbon monoxide, cyanide, ethylene glycol (antifreeze), or methanol.  Excessive use of supplements, including iron and performance-enhancing drinks.  Medicine, such as:  Aspirin.  Acetazolamide.  Lifestyle factors, such as:  Excess alcohol.  Prolonged intense exercise without proper rest and rehydration.  Malnutrition or fasting. SYMPTOMS Mild acidosis may not cause symptoms. More severe acidosis may cause:  Rapid breathing.  Feeling sick to your stomach (nauseous).  Throwing up (vomiting).  Headache.  Confusion.  Fatigue.  Weakness.  Altered level of consciousness. DIAGNOSIS Diagnosis is made by:  Physical exam and patient history.  Blood tests. Sometimes, an arterial blood gas test (ABG) is needed. The blood is taken from an artery in the wrist. This test can measure the specific pH of the blood.  Urine tests. TREATMENT Severe forms of metabolic acidosis  require hospital treatment and inpatient care. Treatment depends on the cause and severity of the metabolic acidosis. Treatment is aimed at restoring balance of acid (normal pH) in the blood and treating the underlying cause. Treatment may include fluid support, administration of sodium bicarbonate to neutralize the acids, or management of kidney disease, diabetes, infections, or other underlying conditions. PROGNOSIS Severe cases of metabolic acidosis require treatment since it can lead to shock or even death. There is a very good chance of correcting metabolic acidosis when the problem that caused it is successfully treated. The earlier the acidosis and cause are found, the more likely the treatment will be successful. PREVENTION To prevent metabolic acidosis in the future, follow these guidelines:  Manage chronic conditions. Take your medicines as directed. Failure to take certain medicines, or taking too much of them, can predispose you to acidosis.  Avoid exposure to poisons and toxins.  Limit alcohol intake.  Drink enough water and fluids to keep your urine clear or pale yellow.  Understand your medicines and supplements, and their possible side effects. HOME CARE INSTRUCTIONS  Continue taking your medicines as directed.  Take any new medicines prescribed by your caregiver. You may be given antibiotic medicine if you have an infection.  Maintain good hydration and nutrition.  If you get dialysis, do not miss any appointments.  Get plenty of rest.  See your caregiver for close follow-up. You may need more blood tests. SEEK IMMEDIATE MEDICAL CARE IF:  You have shortness of breath, chest pain, or a fast heartbeat (palpitations).  You develop worsening nausea, vomiting, or uncontrolled diarrhea.  You develop worsening body aches, fatigue, or lethargy.  You experience worsening levels of consciousness or you faint.  You develop worsening  signs of dehydration, you cannot eat,  or you are producing less urine.  You have uncontrolled pain in your joints and limbs.  You have uncontrolled, severe abdominal pain. Document Released: 08/23/2010 Document Revised: 07/31/2011 Document Reviewed: 08/23/2010 [facility] Patient Information 2015 [place], [place]. This information is not intended to replace advice given to you by your health care provider. Make sure you discuss any questions you have with your health care provider.

## 2014-05-06 NOTE — ED Notes (Signed)
Bed: WA24 Expected date:  Expected time:  Means of arrival:  Comments: EMS 

## 2014-06-03 ENCOUNTER — Ambulatory Visit (INDEPENDENT_AMBULATORY_CARE_PROVIDER_SITE_OTHER): Payer: Medicare Other | Admitting: Family Medicine

## 2014-06-03 ENCOUNTER — Ambulatory Visit (INDEPENDENT_AMBULATORY_CARE_PROVIDER_SITE_OTHER): Payer: Medicare Other

## 2014-06-03 VITALS — BP 124/64 | HR 81 | Temp 98.1°F | Resp 16 | Ht 70.0 in | Wt 145.0 lb

## 2014-06-03 DIAGNOSIS — J90 Pleural effusion, not elsewhere classified: Secondary | ICD-10-CM

## 2014-06-03 DIAGNOSIS — J449 Chronic obstructive pulmonary disease, unspecified: Secondary | ICD-10-CM

## 2014-06-03 DIAGNOSIS — I10 Essential (primary) hypertension: Secondary | ICD-10-CM

## 2014-06-03 DIAGNOSIS — Z23 Encounter for immunization: Secondary | ICD-10-CM

## 2014-06-03 LAB — COMPREHENSIVE METABOLIC PANEL
ALT: 9 U/L (ref 0–53)
AST: 16 U/L (ref 0–37)
Albumin: 3.3 g/dL — ABNORMAL LOW (ref 3.5–5.2)
Alkaline Phosphatase: 51 U/L (ref 39–117)
BUN: 11 mg/dL (ref 6–23)
CO2: 34 mEq/L — ABNORMAL HIGH (ref 19–32)
Calcium: 9.2 mg/dL (ref 8.4–10.5)
Chloride: 91 mEq/L — ABNORMAL LOW (ref 96–112)
Creat: 0.66 mg/dL (ref 0.50–1.35)
Glucose, Bld: 89 mg/dL (ref 70–99)
Potassium: 5.1 mEq/L (ref 3.5–5.3)
Sodium: 132 mEq/L — ABNORMAL LOW (ref 135–145)
Total Bilirubin: 0.7 mg/dL (ref 0.2–1.2)
Total Protein: 6 g/dL (ref 6.0–8.3)

## 2014-06-03 LAB — POCT CBC
Granulocyte percent: 90 %G — AB (ref 37–80)
HCT, POC: 43.5 % (ref 43.5–53.7)
Hemoglobin: 13.7 g/dL — AB (ref 14.1–18.1)
Lymph, poc: 1.3 (ref 0.6–3.4)
MCH, POC: 29.2 pg (ref 27–31.2)
MCHC: 31.4 g/dL — AB (ref 31.8–35.4)
MCV: 93 fL (ref 80–97)
MID (cbc): 0.4 (ref 0–0.9)
MPV: 5.2 fL (ref 0–99.8)
POC Granulocyte: 15 — AB (ref 2–6.9)
POC LYMPH PERCENT: 7.5 %L — AB (ref 10–50)
POC MID %: 2.5 %M (ref 0–12)
Platelet Count, POC: 465 10*3/uL — AB (ref 142–424)
RBC: 4.68 M/uL — AB (ref 4.69–6.13)
RDW, POC: 17.2 %
WBC: 16.7 10*3/uL — AB (ref 4.6–10.2)

## 2014-06-03 MED ORDER — PNEUMOCOCCAL 13-VAL CONJ VACC IM SUSP: 0.5000 mL | INTRAMUSCULAR | Status: DC

## 2014-06-03 MED ORDER — LEVOFLOXACIN 500 MG PO TABS: 500.0000 mg | ORAL_TABLET | Freq: Every day | ORAL | Status: DC

## 2014-06-03 MED ORDER — BENAZEPRIL HCL 40 MG PO TABS: ORAL_TABLET | ORAL | Status: DC

## 2014-06-03 MED ORDER — FUROSEMIDE 20 MG PO TABS: 20.0000 mg | ORAL_TABLET | Freq: Every day | ORAL | Status: DC

## 2014-06-03 MED ORDER — AMLODIPINE BESYLATE 5 MG PO TABS: 5.0000 mg | ORAL_TABLET | Freq: Every day | ORAL | Status: DC

## 2014-06-03 MED ORDER — FLUTICASONE-SALMETEROL 230-21 MCG/ACT IN AERO: 2.0000 | INHALATION_SPRAY | Freq: Two times a day (BID) | RESPIRATORY_TRACT | Status: AC

## 2014-06-03 NOTE — Progress Notes (Signed)
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 He's completed all his antibiotics.  Patient has also completed his course of Lasix and has been out of that for 2 weeks. He states that he has had no increase in swelling, his appetite is better, his cough is just occasional and he's sleeping through the night very well. He continues on his oxygen and asks how long he needs to be on this.  Objective: No acute distress, patient on oxygen HEENT: Unremarkable Patient's color is good Chest: Few faint rales at the bases, distant breath sounds otherwise. Somewhat of a barrel chest.  Heart: Regular without murmur or gallop Abdomen: Soft and nontender Extremities: 1-2+ lower extremity edema bilaterally Skin: Warm and dry with some scaling  [facility] reading (PRIMARY) by  Dr. Joseph Art:  Chest x-ray shows blunting of the right hemidiaphragm, clearing of the left lower lobe infiltrate, and increased density of the middle fissure on the right with suggestion of increased fluid.  Results for orders placed or performed in visit on 06/03/14  POCT CBC  Result Value Ref Range   WBC 16.7 (A) 4.6 - 10.2 K/uL   Lymph, poc 1.3 0.6 - 3.4   POC LYMPH PERCENT 7.5 (A) 10 - 50 %L   MID (cbc) 0.4 0 - 0.9   POC MID % 2.5 0 - 12 %M   POC Granulocyte 15.0 (A) 2 - 6.9   Granulocyte percent 90.0 (A) 37 - 80 %G   RBC 4.68 (A) 4.69 - 6.13 M/uL   Hemoglobin 13.7 (A) 14.1 - 18.1 g/dL   HCT, POC 43.5 43.5 - 53.7 %   MCV 93.0 80 - 97 fL   MCH, POC 29.2 27 - 31.2 pg   MCHC 31.4 (A) 31.8 - 35.4 g/dL   RDW, POC 17.2 %   Platelet Count, POC 465 (A) 142 - 424 K/uL   MPV 5.2 0 - 99.8 fL   Assessment: 69 year old gentleman who is medically from his pneumonia last month but continues to have elevated white count, fluid cannulation in the costophrenic angle, and new fluid in the fissure as well as possible infiltrate there.  This chart was scribed in my presence and reviewed by  me personally.    ICD-9-CM ICD-10-CM   1. Chronic obstructive pulmonary disease, unspecified COPD, unspecified chronic bronchitis type 496 J44.9 fluticasone-salmeterol (ADVAIR HFA) 230-21 MCG/ACT inhaler     DG Chest 2 View     POCT CBC     Pneumococcal conjugate vaccine 13-valent IM     furosemide (LASIX) 20 MG tablet     levofloxacin (LEVAQUIN) 500 MG tablet     DISCONTINUED: pneumococcal 13-valent conjugate vaccine (PREVNAR 13) injection 0.5 mL  2. Essential hypertension 401.9 I10 benazepril (LOTENSIN) 40 MG tablet     amLODipine (NORVASC) 5 MG tablet     POCT CBC     Comprehensive metabolic panel     Pneumococcal conjugate vaccine 13-valent IM  3. Immunization due V05.9 Z23 Pneumococcal conjugate vaccine 13-valent IM  4. Pleural effusion 511.9 J90 furosemide (LASIX) 20 MG tablet   I spoke with the radiologist and we need to follow this gentleman fairly closely. The new area of airspace disease requires another round of antibiotic and follow-up in 2 weeks.  Signed, Robyn Haber, MD

## 2014-06-03 NOTE — Patient Instructions (Addendum)
You have still some infection in the right lung which she's be treated in another 10 days with antibiotics. I'm also giving you some fluid pills to get rid of the extra fluid in your lungs and lower legs. I would like to have you come back in another 2 weeks for follow-up x-ray to make sure that all of this is cleared.    Pneumococcal Vaccine, Polyvalent suspension for injection What is this medicine? PNEUMOCOCCAL VACCINE, POLYVALENT (NEU mo KOK al vak SEEN, pol ee VEY luhnt) is a vaccine to prevent pneumococcus bacteria infection. These bacteria are a major cause of ear infections, 'Strep throat' infections, and serious pneumonia, meningitis, or blood infections worldwide. These vaccines help the body to produce antibodies (protective substances) that help your body defend against these bacteria. This vaccine is recommended for infants and young children. This vaccine will not treat an infection. This medicine may be used for other purposes; ask your health care provider or pharmacist if you have questions. COMMON BRAND NAME(S): Prevnar 13 What should I tell my health care provider before I take this medicine? They need to know if you have any of these conditions: -bleeding problems -fever -immune system problems -low platelet count in the blood -seizures -an unusual or allergic reaction to pneumococcal vaccine, diphtheria toxoid, other vaccines, latex, other medicines, foods, dyes, or preservatives -pregnant or trying to get pregnant -breast-feeding How should I use this medicine? This vaccine is for injection into a muscle. It is given by a health care professional. A copy of Vaccine Information Statements will be given before each vaccination. Read this sheet carefully each time. The sheet may change frequently. Talk to your pediatrician regarding the use of this medicine in children. While this drug may be prescribed for children as young as 33 weeks old for selected conditions, precautions  do apply. Overdosage: If you think you have taken too much of this medicine contact a poison control center or emergency room at once. NOTE: This medicine is only for you. Do not share this medicine with others. What if I miss a dose? It is important not to miss your dose. Call your doctor or health care professional if you are unable to keep an appointment. What may interact with this medicine? -medicines for cancer chemotherapy -medicines that suppress your immune function -medicines that treat or prevent blood clots like warfarin, enoxaparin, and dalteparin -steroid medicines like prednisone or cortisone This list may not describe all possible interactions. Give your health care provider a list of all the medicines, herbs, non-prescription drugs, or dietary supplements you use. Also tell them if you smoke, drink alcohol, or use illegal drugs. Some items may interact with your medicine. What should I watch for while using this medicine? Mild fever and pain should go away in 3 days or less. Report any unusual symptoms to your doctor or health care professional. What side effects may I notice from receiving this medicine? Side effects that you should report to your doctor or health care professional as soon as possible: -allergic reactions like skin rash, itching or hives, swelling of the face, lips, or tongue -breathing problems -confused -fever over 102 degrees F -pain, tingling, numbness in the hands or feet -seizures -unusual bleeding or bruising -unusual muscle weakness Side effects that usually do not require medical attention (report to your doctor or health care professional if they continue or are bothersome): -aches and pains -diarrhea -fever of 102 degrees F or less -headache -irritable -loss of appetite -pain, tender  at site where injected -trouble sleeping This list may not describe all possible side effects. Call your doctor for medical advice about side effects. You may  report side effects to FDA at 1-800-FDA-1088. Where should I keep my medicine? This does not apply. This vaccine is given in a clinic, pharmacy, doctor's office, or other health care setting and will not be stored at home. NOTE: This sheet is a summary. It may not cover all possible information. If you have questions about this medicine, talk to your doctor, pharmacist, or health care provider.  2015, [employer]. (2008-07-21 10:17:22)

## 2014-06-04 ENCOUNTER — Other Ambulatory Visit: Payer: Self-pay | Admitting: Family Medicine

## 2014-06-05 ENCOUNTER — Other Ambulatory Visit: Payer: Self-pay | Admitting: Family Medicine

## 2014-06-05 NOTE — Telephone Encounter (Signed)
Dr L, I can try to do a PA on Monday, but even then it may take a few days to be reviewed by ins. I looked at PA form and it doesn't indicate what medication in this class is preferred. Do you have any samples of any kind. I looked for coupons earlier for another pt of Dr Perfecto Kingdom and there were not any. Dr Everlene Farrier does have a sample of Dulera in his cabinet, you may want to check with him.

## 2014-06-05 NOTE — Telephone Encounter (Signed)
Patient needs a PA on his inhaler. Please call when approved.  He needs this medication to live  Do we have any inhalers here to get him by.  [phone]

## 2014-06-08 NOTE — Telephone Encounter (Signed)
Called pharm to check if new Rx for Advair sent 06/06/14 was covered by ins and was advised it was and pt p/up. No PA needed.

## 2014-07-30 ENCOUNTER — Ambulatory Visit (INDEPENDENT_AMBULATORY_CARE_PROVIDER_SITE_OTHER): Payer: Medicare Other | Admitting: Family Medicine

## 2014-07-30 VITALS — BP 106/58 | HR 69 | Temp 97.9°F | Resp 12 | Ht 69.0 in | Wt 142.0 lb

## 2014-07-30 DIAGNOSIS — R0600 Dyspnea, unspecified: Secondary | ICD-10-CM | POA: Diagnosis not present

## 2014-07-30 DIAGNOSIS — I10 Essential (primary) hypertension: Secondary | ICD-10-CM | POA: Diagnosis not present

## 2014-07-30 DIAGNOSIS — J441 Chronic obstructive pulmonary disease with (acute) exacerbation: Secondary | ICD-10-CM | POA: Diagnosis not present

## 2014-07-30 MED ORDER — BENAZEPRIL HCL 40 MG PO TABS: ORAL_TABLET | ORAL | Status: AC

## 2014-07-30 MED ORDER — AMLODIPINE BESYLATE 5 MG PO TABS: 5.0000 mg | ORAL_TABLET | Freq: Every day | ORAL | Status: AC

## 2014-07-30 MED ORDER — LEVOFLOXACIN 500 MG PO TABS: 500.0000 mg | ORAL_TABLET | Freq: Every day | ORAL | Status: AC

## 2014-07-30 MED ORDER — PREDNISONE 20 MG PO TABS: ORAL_TABLET | ORAL | Status: DC

## 2014-07-30 MED ORDER — ALBUTEROL SULFATE HFA 108 (90 BASE) MCG/ACT IN AERS: 2.0000 | INHALATION_SPRAY | Freq: Four times a day (QID) | RESPIRATORY_TRACT | Status: AC | PRN

## 2014-07-30 MED ORDER — FUROSEMIDE 20 MG PO TABS: 20.0000 mg | ORAL_TABLET | Freq: Every day | ORAL | Status: DC

## 2014-07-30 NOTE — Patient Instructions (Addendum)
Drink plenty of fluids to help keep secretions thin    Continue using your home oxygen as necessary  Use the inhaler ,  Pro Air ,  2 inhalations every 4-6 hours when needed.    take the prednisone 3 pills daily for 2 days, then 2 daily for 2 days , then 1 daily for 2 days. Best taken in the morning.  Continue the Advair    Take the antibiotic  Levaquin1 daily  For one-week   Consider getting a pulse oximeter for home use to monitor your oxygen level. Ideally it should stay above 94%, but most concern is if he gets below 90%.  Return if not improving

## 2014-07-30 NOTE — Progress Notes (Signed)
Subjective:  69 year old man who is here for a  problem with increased congestion. He has a long history of COPD, is on home oxygen on an as-needed basis. He is a former smoker, only really tries to pick up a cigarette now. He has very little exertional capability. Most of the time he just the throat home and reads. He has been more congested over the last couple weeks and coughing up some phlegm. He is short of breath. No fever. He lives with his wife of 60 years.  Objective:  Obvious COPD , splinting for breathing. His TMs are normal. Throat clear. Neck supple without nodes. Chest has poor air exchange but no rhonchi, rales, or wheezes were audible at this time. His heart is regular without murmurs. O2 saturation was satisfactory on his oxygen.  Assessment:  COPD with exacerbation History of hypertension   Plan:  discussed his oxygen levels with him. Consider getting a pulse oximeter for home monitoring.   He tolerated Levaquin previously in the last course of antibiotics, so we will give him that again. I think a brief course of steroids will help to promote better also.

## 2014-11-28 ENCOUNTER — Other Ambulatory Visit: Payer: Self-pay | Admitting: Family Medicine

## 2014-12-11 ENCOUNTER — Encounter (HOSPITAL_COMMUNITY): Payer: Self-pay | Admitting: Emergency Medicine

## 2014-12-11 ENCOUNTER — Emergency Department (HOSPITAL_COMMUNITY): Payer: Medicare Other

## 2014-12-11 ENCOUNTER — Emergency Department (HOSPITAL_COMMUNITY)
Admission: EM | Admit: 2014-12-11 | Discharge: 2014-12-11 | Disposition: A | Discharge status: Home / Self Care | Payer: Medicare Other | Attending: Emergency Medicine | Admitting: Emergency Medicine

## 2014-12-11 DIAGNOSIS — Z79899 Other long term (current) drug therapy: Secondary | ICD-10-CM | POA: Insufficient documentation

## 2014-12-11 DIAGNOSIS — Z7951 Long term (current) use of inhaled steroids: Secondary | ICD-10-CM | POA: Diagnosis not present

## 2014-12-11 DIAGNOSIS — R05 Cough: Secondary | ICD-10-CM

## 2014-12-11 DIAGNOSIS — Z87891 Personal history of nicotine dependence: Secondary | ICD-10-CM | POA: Insufficient documentation

## 2014-12-11 DIAGNOSIS — I10 Essential (primary) hypertension: Secondary | ICD-10-CM | POA: Diagnosis not present

## 2014-12-11 DIAGNOSIS — J441 Chronic obstructive pulmonary disease with (acute) exacerbation: Secondary | ICD-10-CM | POA: Diagnosis not present

## 2014-12-11 DIAGNOSIS — Z7982 Long term (current) use of aspirin: Secondary | ICD-10-CM | POA: Diagnosis not present

## 2014-12-11 DIAGNOSIS — I509 Heart failure, unspecified: Secondary | ICD-10-CM | POA: Diagnosis not present

## 2014-12-11 DIAGNOSIS — R059 Cough, unspecified: Secondary | ICD-10-CM

## 2014-12-11 DIAGNOSIS — R0602 Shortness of breath: Secondary | ICD-10-CM | POA: Diagnosis present

## 2014-12-11 LAB — CBC
HCT: 39.3 % (ref 39.0–52.0)
HEMOGLOBIN: 12.9 g/dL — AB (ref 13.0–17.0)
MCH: 32.4 pg (ref 26.0–34.0)
MCHC: 32.8 g/dL (ref 30.0–36.0)
MCV: 98.7 fL (ref 78.0–100.0)
Platelets: 179 10*3/uL (ref 150–400)
RBC: 3.98 MIL/uL — AB (ref 4.22–5.81)
RDW: 13.2 % (ref 11.5–15.5)
WBC: 13.8 10*3/uL — AB (ref 4.0–10.5)

## 2014-12-11 LAB — BASIC METABOLIC PANEL
Anion gap: 8 (ref 5–15)
BUN: 27 mg/dL — AB (ref 6–20)
CO2: 32 mmol/L (ref 22–32)
Calcium: 8.8 mg/dL — ABNORMAL LOW (ref 8.9–10.3)
Chloride: 95 mmol/L — ABNORMAL LOW (ref 101–111)
Creatinine, Ser: 0.91 mg/dL (ref 0.61–1.24)
GFR calc non Af Amer: >60 mL/min (ref >60)
GLUCOSE: 123 mg/dL — AB (ref 65–99)
POTASSIUM: 5.2 mmol/L — AB (ref 3.5–5.1)
Sodium: 135 mmol/L (ref 135–145)

## 2014-12-11 LAB — BRAIN NATRIURETIC PEPTIDE: B NATRIURETIC PEPTIDE 5: 115.5 pg/mL — AB (ref 0.0–100.0)

## 2014-12-11 LAB — I-STAT TROPONIN, ED: Troponin i, poc: 0.02 ng/mL (ref 0.00–0.08)

## 2014-12-11 MED ORDER — AMOXICILLIN-POT CLAVULANATE 875-125 MG PO TABS: 1.0000 | ORAL_TABLET | Freq: Two times a day (BID) | ORAL | Status: AC

## 2014-12-11 MED ORDER — IPRATROPIUM BROMIDE 0.02 % IN SOLN
0.5000 mg | Freq: Once | RESPIRATORY_TRACT | Status: AC
  Administered 2014-12-11: 0.5 mg via RESPIRATORY_TRACT
  Filled 2014-12-11: qty 2.5

## 2014-12-11 MED ORDER — PREDNISONE 20 MG PO TABS: 40.0000 mg | ORAL_TABLET | Freq: Every day | ORAL | Status: AC

## 2014-12-11 MED ORDER — AMOXICILLIN-POT CLAVULANATE 875-125 MG PO TABS
1.0000 | ORAL_TABLET | Freq: Once | ORAL | Status: AC
  Administered 2014-12-11: 1 via ORAL
  Filled 2014-12-11: qty 1

## 2014-12-11 MED ORDER — ALBUTEROL SULFATE (2.5 MG/3ML) 0.083% IN NEBU
5.0000 mg | INHALATION_SOLUTION | Freq: Once | RESPIRATORY_TRACT | Status: AC
  Administered 2014-12-11: 5 mg via RESPIRATORY_TRACT
  Filled 2014-12-11: qty 6

## 2014-12-11 NOTE — ED Notes (Signed)
RT called

## 2014-12-11 NOTE — ED Notes (Signed)
Off floor for testing 

## 2014-12-11 NOTE — ED Notes (Signed)
Bed: [facility]88 Expected date:  Expected time:  Means of arrival:  Comments: EMS- 69yo M, SOB, COPD/Asthma

## 2014-12-11 NOTE — ED Notes (Signed)
Per EMS-SOB for 1 week-history of COPD, possible CHF-placed on 3 L 6 months ago-was released 6 months ago from [facility] hospitalization-SOB at rest, dim in all fields-given 5 mg of albuterol and 125 mg of solumedrol in route-98% on 3 L-was able to ambulate to ambulance without difficulty

## 2014-12-11 NOTE — Discharge Instructions (Signed)
Chronic Obstructive Pulmonary Disease  Chronic obstructive pulmonary disease (COPD) is a common lung condition in which airflow from the lungs is limited. COPD is a general term that can be used to describe many different lung problems that limit airflow, including both chronic bronchitis and emphysema. If you have COPD, your lung function will probably never return to normal, but there are measures you can take to improve lung function and make yourself feel better.   CAUSES    Smoking (common).    Exposure to secondhand smoke.    Genetic problems.   Chronic inflammatory lung diseases or recurrent infections.  SYMPTOMS    Shortness of breath, especially with physical activity.    Deep, persistent (chronic) cough with a large amount of thick mucus.    Wheezing.    Rapid breaths (tachypnea).    Gray or bluish discoloration (cyanosis) of the skin, especially in fingers, toes, or lips.    Fatigue.    Weight loss.    Frequent infections or episodes when breathing symptoms become much worse (exacerbations).    Chest tightness.  DIAGNOSIS   Your health care provider will take a medical history and perform a physical examination to make the initial diagnosis. Additional tests for COPD may include:    Lung (pulmonary) function tests.   Chest X-ray.   CT scan.   Blood tests.  TREATMENT   Treatment available to help you feel better when you have COPD includes:    Inhaler and nebulizer medicines. These help manage the symptoms of COPD and make your breathing more comfortable.   Supplemental oxygen. Supplemental oxygen is only helpful if you have a low oxygen level in your blood.    Exercise and physical activity. These are beneficial for nearly all people with COPD. Some people may also benefit from a pulmonary rehabilitation program.  HOME CARE INSTRUCTIONS    Take all medicines (inhaled or pills) as directed by your health care provider.   Avoid over-the-counter medicines or cough syrups  that dry up your airway (such as antihistamines) and slow down the elimination of secretions unless instructed otherwise by your health care provider.    If you are a smoker, the most important thing that you can do is stop smoking. Continuing to smoke will cause further lung damage and breathing trouble. Ask your health care provider for help with quitting smoking. He or she can direct you to community resources or hospitals that provide support.   Avoid exposure to irritants such as smoke, chemicals, and fumes that aggravate your breathing.   Use oxygen therapy and pulmonary rehabilitation if directed by your health care provider. If you require home oxygen therapy, ask your health care provider whether you should purchase a pulse oximeter to measure your oxygen level at home.    Avoid contact with individuals who have a contagious illness.   Avoid extreme temperature and humidity changes.   Eat healthy foods. Eating smaller, more frequent meals and resting before meals may help you maintain your strength.   Stay active, but balance activity with periods of rest. Exercise and physical activity will help you maintain your ability to do things you want to do.   Preventing infection and hospitalization is very important when you have COPD. Make sure to receive all the vaccines your health care provider recommends, especially the pneumococcal and influenza vaccines. Ask your health care provider whether you need a pneumonia vaccine.   Learn and use relaxation techniques to manage stress.   Learn   going to whistle and breathe out (exhale) through the pursed lips for 2 seconds.   Diaphragmatic breathing. Start by putting one hand on your abdomen just above  your waist. Inhale slowly through your nose. The hand on your abdomen should move out. Then purse your lips and exhale slowly. You should be able to feel the hand on your abdomen moving in as you exhale.   Learn and use controlled coughing to clear mucus from your lungs. Controlled coughing is a series of short, progressive coughs. The steps of controlled coughing are:  1. Lean your head slightly forward.  2. Breathe in deeply using diaphragmatic breathing.  3. Try to hold your breath for 3 seconds.  4. Keep your mouth slightly open while coughing twice.  5. Spit any mucus out into a tissue.  6. Rest and repeat the steps once or twice as needed. SEEK MEDICAL CARE IF:   You are coughing up more mucus than usual.   There is a change in the color or thickness of your mucus.   Your breathing is more labored than usual.   Your breathing is faster than usual.  SEEK IMMEDIATE MEDICAL CARE IF:   You have shortness of breath while you are resting.   You have shortness of breath that prevents you from:  Being able to talk.   Performing your usual physical activities.   You have chest pain lasting longer than 5 minutes.   Your skin color is more cyanotic than usual.  You measure low oxygen saturations for longer than 5 minutes with a pulse oximeter. MAKE SURE YOU:   Understand these instructions.  Will watch your condition.  Will get help right away if you are not doing well or get worse. Document Released: 02/15/2005 Document Revised: 09/22/2013 Document Reviewed: 01/02/2013 [facility] Patient Information 2015 [place], [place]. This information is not intended to replace advice given to you by your health care provider. Make sure you discuss any questions you have with your health care provider. Upper Respiratory Infection, Adult An upper respiratory infection (URI) is also sometimes known as the common cold. The upper respiratory tract includes the nose, sinuses, throat,  trachea, and bronchi. Bronchi are the airways leading to the lungs. Most people improve within 1 week, but symptoms can last up to 2 weeks. A residual cough may last even longer.  CAUSES Many different viruses can infect the tissues lining the upper respiratory tract. The tissues become irritated and inflamed and often become very moist. Mucus production is also common. A cold is contagious. You can easily spread the virus to others by oral contact. This includes kissing, sharing a glass, coughing, or sneezing. Touching your mouth or nose and then touching a surface, which is then touched by another person, can also spread the virus. SYMPTOMS  Symptoms typically develop 1 to 3 days after you come in contact with a cold virus. Symptoms vary from person to person. They may include:  Runny nose.  Sneezing.  Nasal congestion.  Sinus irritation.  Sore throat.  Loss of voice (laryngitis).  Cough.  Fatigue.  Muscle aches.  Loss of appetite.  Headache.  Low-grade fever. DIAGNOSIS  You might diagnose your own cold based on familiar symptoms, since most people get a cold 2 to 3 times a year. Your caregiver can confirm this based on your exam. Most importantly, your caregiver can check that your symptoms are not due to another disease such as strep throat, sinusitis, pneumonia, asthma, or epiglottitis. Blood tests,  throat tests, and X-rays are not necessary to diagnose a common cold, but they may sometimes be helpful in excluding other more serious diseases. Your caregiver will decide if any further tests are required. RISKS AND COMPLICATIONS  You may be at risk for a more severe case of the common cold if you smoke cigarettes, have chronic heart disease (such as heart failure) or lung disease (such as asthma), or if you have a weakened immune system. The very young and very old are also at risk for more serious infections. Bacterial sinusitis, middle ear infections, and bacterial pneumonia can  complicate the common cold. The common cold can worsen asthma and chronic obstructive pulmonary disease (COPD). Sometimes, these complications can require emergency medical care and may be life-threatening. PREVENTION  The best way to protect against getting a cold is to practice good hygiene. Avoid oral or hand contact with people with cold symptoms. Wash your hands often if contact occurs. There is no clear evidence that vitamin C, vitamin E, echinacea, or exercise reduces the chance of developing a cold. However, it is always recommended to get plenty of rest and practice good nutrition. TREATMENT  Treatment is directed at relieving symptoms. There is no cure. Antibiotics are not effective, because the infection is caused by a virus, not by bacteria. Treatment may include:  Increased fluid intake. Sports drinks offer valuable electrolytes, sugars, and fluids.  Breathing heated mist or steam (vaporizer or shower).  Eating chicken soup or other clear broths, and maintaining good nutrition.  Getting plenty of rest.  Using gargles or lozenges for comfort.  Controlling fevers with ibuprofen or acetaminophen as directed by your caregiver.  Increasing usage of your inhaler if you have asthma. Zinc gel and zinc lozenges, taken in the first 24 hours of the common cold, can shorten the duration and lessen the severity of symptoms. Pain medicines may help with fever, muscle aches, and throat pain. A variety of non-prescription medicines are available to treat congestion and runny nose. Your caregiver can make recommendations and may suggest nasal or lung inhalers for other symptoms.  HOME CARE INSTRUCTIONS   Only take over-the-counter or prescription medicines for pain, discomfort, or fever as directed by your caregiver.  Use a warm mist humidifier or inhale steam from a shower to increase air moisture. This may keep secretions moist and make it easier to breathe.  Drink enough water and fluids to  keep your urine clear or pale yellow.  Rest as needed.  Return to work when your temperature has returned to normal or as your caregiver advises. You may need to stay home longer to avoid infecting others. You can also use a face mask and careful hand washing to prevent spread of the virus. SEEK MEDICAL CARE IF:   After the first few days, you feel you are getting worse rather than better.  You need your caregiver's advice about medicines to control symptoms.  You develop chills, worsening shortness of breath, or brown or red sputum. These may be signs of pneumonia.  You develop yellow or brown nasal discharge or pain in the face, especially when you bend forward. These may be signs of sinusitis.  You develop a fever, swollen neck glands, pain with swallowing, or white areas in the back of your throat. These may be signs of strep throat. SEEK IMMEDIATE MEDICAL CARE IF:   You have a fever.  You develop severe or persistent headache, ear pain, sinus pain, or chest pain.  You develop wheezing,  a prolonged cough, cough up blood, or have a change in your usual mucus (if you have chronic lung disease).  You develop sore muscles or a stiff neck. Document Released: 11/01/2000 Document Revised: 07/31/2011 Document Reviewed: 08/13/2013 [facility] Patient Information 2015 [place], [place]. This information is not intended to replace advice given to you by your health care provider. Make sure you discuss any questions you have with your health care provider.

## 2014-12-11 NOTE — ED Provider Notes (Signed)
CSN: [id number]     Arrival date & time 12/11/14  1455 History   First MD Initiated Contact with Patient 12/11/14 1519     Chief Complaint  Patient presents with  . Shortness of Breath     (Consider location/radiation/quality/duration/timing/severity/associated sxs/prior Treatment) HPI Comments: Patient with past medical history of COPD, hypertension, CHF, presents to the emergency department with chief complaint of shortness of breath. Patient states that he has had gradually worsening shortness of breath for the past 2-3 weeks. He states that he has had several episodes of pneumonia within the past year. He states that he is concerned that it is coming back. He reports having a mildly productive cough. He denies any fevers or chills. He states that the symptoms worsened this morning, so he came to the ED for evaluation. He was given 125 mg of Solu-Medrol by EMS and a breathing treatment with no relief. He is a former smoker. He denies any other associated symptoms.  The history is provided by the patient. No language interpreter was used.    Past Medical History  Diagnosis Date  . COPD (chronic obstructive pulmonary disease)   . Hypertension   . EtOH dependence   . Tobacco dependence   . CHF (congestive heart failure)    History reviewed. No pertinent past surgical history. No family history on file. History  Substance Use Topics  . Smoking status: Former Smoker -- 1.00 packs/day for 45 years    Types: Cigarettes    Quit date: 08/25/2013  . Smokeless tobacco: Never Used  . Alcohol Use: 6.0 oz/week    10 Cans of beer per week    Review of Systems  Constitutional: Negative for fever and chills.  Respiratory: Positive for cough and shortness of breath.   Cardiovascular: Negative for chest pain.  Gastrointestinal: Negative for nausea, vomiting, diarrhea and constipation.  Genitourinary: Negative for dysuria.  All other systems reviewed and are negative.     Allergies   Review of patient's allergies indicates no known allergies.  Home Medications   Prior to Admission medications   Medication Sig Start Date End Date Taking? Authorizing Provider  albuterol (PROAIR HFA) 108 (90 BASE) MCG/ACT inhaler Inhale 2 puffs into the lungs every 6 (six) hours as needed for wheezing or shortness of breath. 07/30/14  Yes Posey Boyer, MD  amLODipine (NORVASC) 5 MG tablet Take 1 tablet (5 mg total) by mouth daily. 07/30/14  Yes Posey Boyer, MD  aspirin 325 MG tablet Take 1 tablet (325 mg total) by mouth daily. 04/28/14  Yes Maryann Mikhail, DO  benazepril (LOTENSIN) 40 MG tablet TAKE 1 TABLET (40 MG TOTAL) BY MOUTH DAILY. 07/30/14  Yes Posey Boyer, MD  fluticasone-salmeterol (ADVAIR HFA) [phone] MCG/ACT inhaler Inhale 2 puffs into the lungs 2 (two) times daily. Patient taking differently: Inhale 2 puffs into the lungs at bedtime.  06/03/14  Yes Robyn Haber, MD  furosemide (LASIX) 20 MG tablet TAKE 1 TABLET BY MOUTH DAILY.  "OV NEEDED FOR REFILLS" 11/29/14  Yes Chelle Jeffery, PA-C  levofloxacin (LEVAQUIN) 500 MG tablet Take 1 tablet (500 mg total) by mouth daily. Patient not taking: Reported on 12/11/2014 07/30/14   Posey Boyer, MD  predniSONE (DELTASONE) 20 MG tablet Take 3 daily for 2 days, then 2 doses 2 days, then 1 daily for 2 days for lungs Patient not taking: Reported on 12/11/2014 07/30/14   Posey Boyer, MD   BP 145/55 mmHg  Pulse 75  Temp(Src) 98.4  F (36.9 C) (Oral)  Resp 22  SpO2 99% Physical Exam  Constitutional: He is oriented to person, place, and time. He appears well-developed and well-nourished.  HENT:  Head: Normocephalic and atraumatic.  Eyes: Conjunctivae and EOM are normal. Pupils are equal, round, and reactive to light. Right eye exhibits no discharge. Left eye exhibits no discharge. No scleral icterus.  Neck: Normal range of motion. Neck supple. No JVD present.  Cardiovascular: Normal rate, regular rhythm and normal heart sounds.  Exam  reveals no gallop and no friction rub.   No murmur heard. Pulmonary/Chest: Effort normal and breath sounds normal. No respiratory distress. He has no wheezes. He has no rales. He exhibits no tenderness.  Distant lung sounds, no wheezes  Abdominal: Soft. He exhibits no distension and no mass. There is no tenderness. There is no rebound and no guarding.  Musculoskeletal: Normal range of motion. He exhibits no edema or tenderness.  Neurological: He is alert and oriented to person, place, and time.  Skin: Skin is warm and dry.  Psychiatric: He has a normal mood and affect. His behavior is normal. Judgment and thought content normal.  Nursing note and vitals reviewed.   ED Course  Procedures (including critical care time) Labs Review Labs Reviewed  CBC - Abnormal; Notable for the following:    WBC 13.8 (*)    RBC 3.98 (*)    Hemoglobin 12.9 (*)    All other components within normal limits  BASIC METABOLIC PANEL - Abnormal; Notable for the following:    Potassium 5.2 (*)    Chloride 95 (*)    Glucose, Bld 123 (*)    BUN 27 (*)    Calcium 8.8 (*)    All other components within normal limits  BRAIN NATRIURETIC PEPTIDE - Abnormal; Notable for the following:    B Natriuretic Peptide 115.5 (*)    All other components within normal limits  I-STAT TROPOININ, ED    Imaging Review Dg Chest 2 View  12/11/2014   CLINICAL DATA:  Shortness of breath, recurrent pneumonia  EXAM: CHEST  2 VIEW  COMPARISON:  06/03/2014  FINDINGS: Hyperinflation compatible with emphysema is noted. Thin curvilinear right mid lung zone scarring is stable. Heart size is normal. No pneumothorax. No pleural effusion. Patchy left basilar opacity most compatible with scarring is stable since at least 05/05/2014 prior exam.  IMPRESSION: No active cardiopulmonary disease.   Electronically Signed   By: Conchita Paris M.D.   On: 12/11/2014 16:33     EKG Interpretation   Date/Time:  Friday December 11 2014 15:08:03  EDT Ventricular Rate:  74 PR Interval:  157 QRS Duration: 86 QT Interval:  410 QTC Calculation: 455 R Axis:   27 Text Interpretation:  Normal sinus rhythm Premature ventricular complexes  anterior q waves No significant change since last tracing Confirmed by RAY  MD, Andee Poles ([id number]) on 12/11/2014 5:40:31 PM      MDM   Final diagnoses:  SOB (shortness of breath)  Cough    Patient with cough and worsening shortness of breath 2-3 weeks. Check labs, chest x-ray. Patient has had several episodes of pneumonia in the past, and states this feels similar. Vital signs are stable.  Laboratory tests are remarkable for a mild leukocytosis to 13.8, potassium is 5.2, with no visible hemolysis, and BNP is 1:15. Chest x-ray is still pending. Patient is on 3 L of oxygen, which is his baseline. Pulse oxygenation is 99%.  5:57 PM Patient discussed with Dr. Jeanell Sparrow,  who recommends repeat breathing treatment and plan for discharge with augmentin for sputum changes, worsening SOB in a COPD patient.  Patient is well appearing.  7:26 PM Patient reassessed.  Feeling improved after 2nd nebs.  O2 sat is baseline.  Wears oxygen at home.  DC to home with return precautions.    Montine Circle, PA-C 12/11/14 1927  Pattricia Boss, MD 12/12/14 2300

## 2014-12-28 ENCOUNTER — Other Ambulatory Visit: Payer: Self-pay | Admitting: Physician Assistant

## 2015-01-13 ENCOUNTER — Ambulatory Visit (INDEPENDENT_AMBULATORY_CARE_PROVIDER_SITE_OTHER): Payer: Medicare Other | Admitting: Family Medicine

## 2015-01-13 VITALS — BP 102/50 | HR 68 | Temp 98.2°F | Resp 17 | Ht 69.0 in | Wt 133.0 lb

## 2015-01-13 DIAGNOSIS — R0902 Hypoxemia: Secondary | ICD-10-CM | POA: Diagnosis not present

## 2015-01-13 DIAGNOSIS — R197 Diarrhea, unspecified: Secondary | ICD-10-CM | POA: Diagnosis not present

## 2015-01-13 DIAGNOSIS — J441 Chronic obstructive pulmonary disease with (acute) exacerbation: Secondary | ICD-10-CM | POA: Diagnosis not present

## 2015-01-13 LAB — POCT CBC
GRANULOCYTE PERCENT: 87 % — AB (ref 37–80)
HEMATOCRIT: 40.7 % — AB (ref 43.5–53.7)
Hemoglobin: 12.8 g/dL — AB (ref 14.1–18.1)
LYMPH, POC: 0.9 (ref 0.6–3.4)
MCH, POC: 30.5 pg (ref 27–31.2)
MCHC: 31.5 g/dL — AB (ref 31.8–35.4)
MCV: 96.8 fL (ref 80–97)
MID (cbc): 0.6 (ref 0–0.9)
MPV: 5.9 fL (ref 0–99.8)
POC GRANULOCYTE: 10.2 — AB (ref 2–6.9)
POC LYMPH PERCENT: 7.9 %L — AB (ref 10–50)
POC MID %: 5.1 % (ref 0–12)
Platelet Count, POC: 355 10*3/uL (ref 142–424)
RBC: 4.21 M/uL — AB (ref 4.69–6.13)
RDW, POC: 14.1 %
WBC: 11.7 10*3/uL — AB (ref 4.6–10.2)

## 2015-01-13 LAB — POCT UA - MICROSCOPIC ONLY
Bacteria, U Microscopic: NEGATIVE
CASTS, UR, LPF, POC: NEGATIVE
Crystals, Ur, HPF, POC: NEGATIVE
MUCUS UA: NEGATIVE
YEAST UA: NEGATIVE

## 2015-01-13 LAB — POCT URINALYSIS DIPSTICK
BILIRUBIN UA: NEGATIVE
Glucose, UA: NEGATIVE
KETONES UA: NEGATIVE
NITRITE UA: NEGATIVE
PH UA: 5
Protein, UA: NEGATIVE
Spec Grav, UA: 1.01
Urobilinogen, UA: 0.2

## 2015-01-13 MED ORDER — METRONIDAZOLE 500 MG PO TABS: 500.0000 mg | ORAL_TABLET | Freq: Three times a day (TID) | ORAL | Status: AC

## 2015-01-13 NOTE — Progress Notes (Signed)
Diarrhea Subjective:  Patient ID: Luke Pierce, male    DOB: 08-29-1945  Age: 69 y.o. MRN: [id number]  69 year old man who is here primarily for diarrhea. He is in the hospital ER recently with an exacerbation of his COPD. When he came in today they had difficulty getting his oxygen generally he is been having diarrhea for over a week. Initially started with a runny mucousy diarrhea. No blood. Now he is passing about for 5 stools a day, from mushy to runny. No major abdominal pain. No fever. Does not have a history of diarrhea, usually his bowels are functioning normally. He is not taking any medication for this. No history of prostate or urinary difficulties. He says he probably doesn't drink adequate fluids.   Objective:   Medications as noted. He did have steroids recently when he was in the emergency room. Pleasant gentleman in no acute distress. Unable to get an O2 saturation of 85%, with a couple coughs and deep breathing he got it up to 90 on his 3 L nasal O2. His TMs are normal. Throat clear. Adequate hydration. Neck supple without nodes. Chest poor air exchange without rales or rhonchi. Heart regular without murmurs. He does use accessory muscles to breathe. Abdomen has normal bowel sounds. Soft without masses or tenderness.  Assessment & Plan:   Assessment: Diarrhea, at risk of Clostridium difficile with recent hospital time and steroids use COPD with hypoxemia Plan:  Stool test, urinalysis, CBC  Results for orders placed or performed in visit on 01/13/15  POCT CBC  Result Value Ref Range   WBC 11.7 (A) 4.6 - 10.2 K/uL   Lymph, poc 0.9 0.6 - 3.4   POC LYMPH PERCENT 7.9 (A) 10 - 50 %L   MID (cbc) 0.6 0 - 0.9   POC MID % 5.1 0 - 12 %M   POC Granulocyte 10.2 (A) 2 - 6.9   Granulocyte percent 87.0 (A) 37 - 80 %G   RBC 4.21 (A) 4.69 - 6.13 M/uL   Hemoglobin 12.8 (A) 14.1 - 18.1 g/dL   HCT, POC 40.7 (A) 43.5 - 53.7 %   MCV 96.8 80 - 97 fL   MCH, POC 30.5 27 - 31.2 pg   MCHC  31.5 (A) 31.8 - 35.4 g/dL   RDW, POC 14.1 %   Platelet Count, POC 355 142 - 424 K/uL   MPV 5.9 0 - 99.8 fL  POCT UA - Microscopic Only  Result Value Ref Range   WBC, Ur, HPF, POC 3-8    RBC, urine, microscopic 1-4    Bacteria, U Microscopic neg    Mucus, UA neg    Epithelial cells, urine per micros 0-1    Crystals, Ur, HPF, POC neg    Casts, Ur, LPF, POC neg    Yeast, UA neg   POCT urinalysis dipstick  Result Value Ref Range   Color, UA yellow    Clarity, UA clear    Glucose, UA neg    Bilirubin, UA neg    Ketones, UA neg    Spec Grav, UA 1.010    Blood, UA trace-lysed    pH, UA 5.0    Protein, UA neg    Urobilinogen, UA 0.2    Nitrite, UA neg    Leukocytes, UA Trace (A) Negative     Patient Instructions  Drink plenty of fluids  Take metronidazole 500 mg one pill 3 times daily until results of the stool specimen come back. NO ALCOHOL FOR THE FULL  COURSE OF TAKING THIS MEDICINE.  IT WILL MAKE YOU VOMIT.  You can take some Pepto-Bismol if needed for the diarrhea.  If the stool test comes back negative for clostridium difficile you can begin taking Imodium 1 pill 3 or 4 times daily until diarrhea has stopped. Do not keep taking longer because you may end up constipated.  Return at anytime if worse  Continue home oxygen  Recommend getting a pulse oximeter for home use. Can be purchased easily online, and probably can be purchased at a pharmacy.       [name],[name], MD 01/13/2015

## 2015-01-13 NOTE — Patient Instructions (Signed)
Drink plenty of fluids  Take metronidazole 500 mg one pill 3 times daily until results of the stool specimen come back. NO ALCOHOL FOR THE FULL COURSE OF TAKING THIS MEDICINE.  IT WILL MAKE YOU VOMIT.  You can take some Pepto-Bismol if needed for the diarrhea.  If the stool test comes back negative for clostridium difficile you can begin taking Imodium 1 pill 3 or 4 times daily until diarrhea has stopped. Do not keep taking longer because you may end up constipated.  Return at anytime if worse  Continue home oxygen  Recommend getting a pulse oximeter for home use. Can be purchased easily online, and probably can be purchased at a pharmacy.

## 2015-01-14 ENCOUNTER — Telehealth: Payer: Self-pay

## 2015-01-14 LAB — CLOSTRIDIUM DIFFICILE BY PCR: CDIFFPCR: DETECTED — AB

## 2015-01-14 NOTE — Telephone Encounter (Signed)
Called pt for Dr. Linna Darner. Pt was positive for C-Diff. Informed pt he needs to take full course of antibiotics. Informed pt if diarrhea becomes worse or he develops bloody diarrhea he needs to be seen right away at our clinic or the ED.

## 2015-01-27 ENCOUNTER — Other Ambulatory Visit: Payer: Self-pay | Admitting: Family Medicine

## 2015-03-02 ENCOUNTER — Ambulatory Visit (INDEPENDENT_AMBULATORY_CARE_PROVIDER_SITE_OTHER): Payer: Medicare Other

## 2015-03-02 DIAGNOSIS — Z23 Encounter for immunization: Secondary | ICD-10-CM

## 2015-04-25 ENCOUNTER — Telehealth: Payer: Self-pay | Admitting: Radiology

## 2015-04-25 NOTE — Telephone Encounter (Signed)
Paramedic called to see if Dr. Linna Darner is willing to sign death certificate for pt that has died this morning. Paramedic spoke with Dr. Ouida Sills.

## 2015-05-23 DEATH — deceased

## 2016-03-16 IMAGING — CT CT HEAD W/O CM
3 series · 18 of 30 positions shown, 20 images · non-contrast
Comparison: None.

CLINICAL DATA: Left arm weakness, denies fall or trauma, large
hematoma over the left forearm.

EXAM:
CT HEAD WITHOUT CONTRAST
TECHNIQUE: Contiguous axial images were obtained from the base of the skull
through the vertex without intravenous contrast.

[Series 201: head w/o, idose (1) · axial · non-contrast · 0.49mm/px · z∈[+67,+177]mm · 6 of 32 slices shown, 8 images]
[im 5/32  brain]
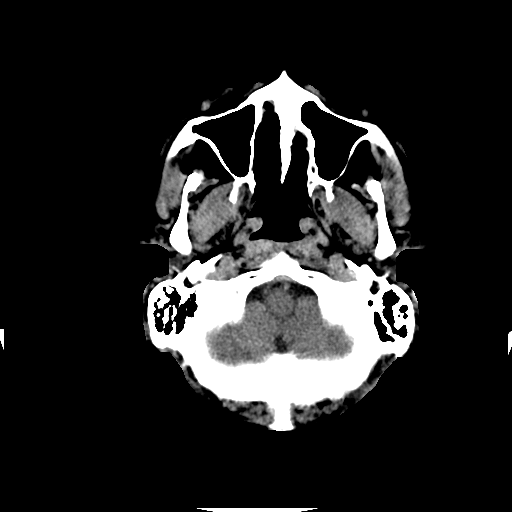
[im 5/32  bone]
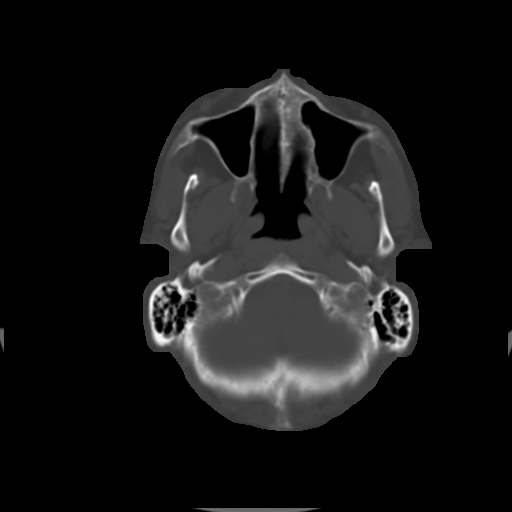
[im 9/32  brain]
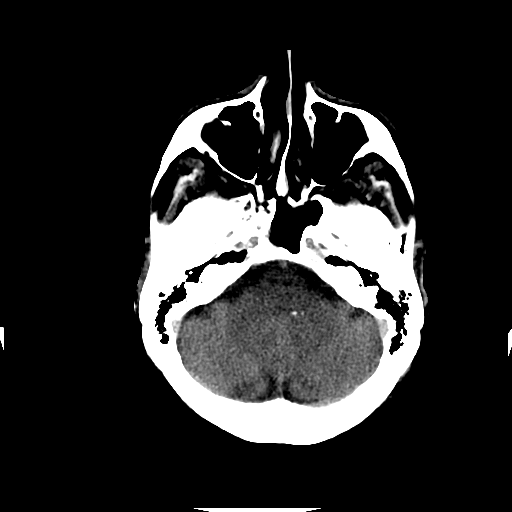
[im 14/32  brain]
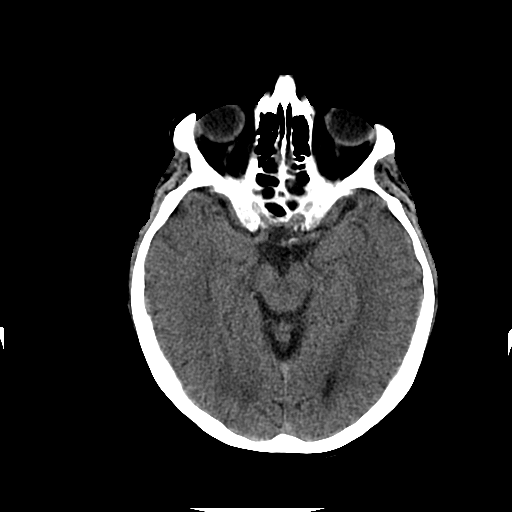
[im 18/32  brain]
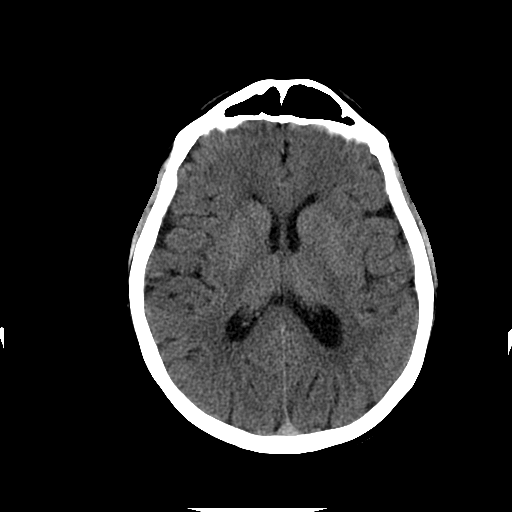
[im 23/32  brain]
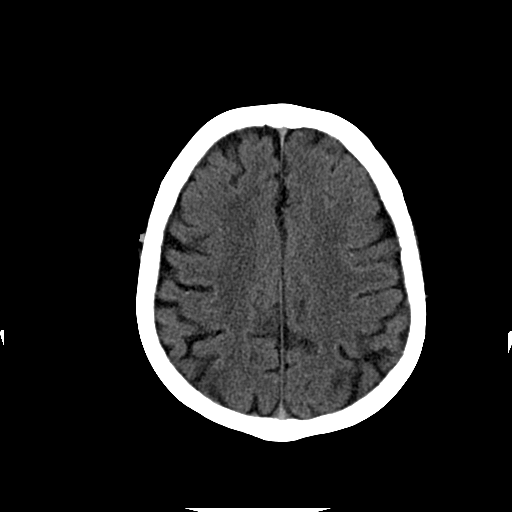
[im 23/32  bone]
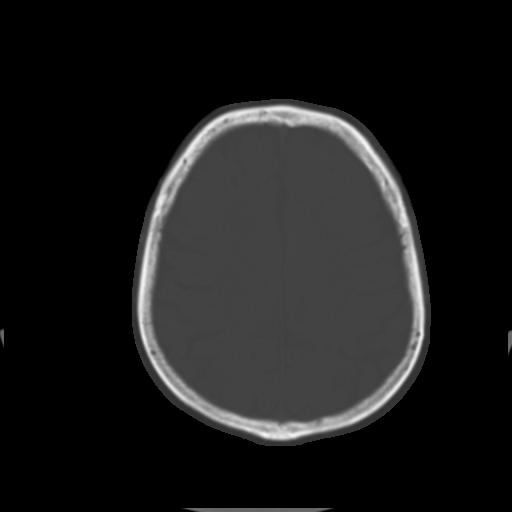
[im 27/32  brain]
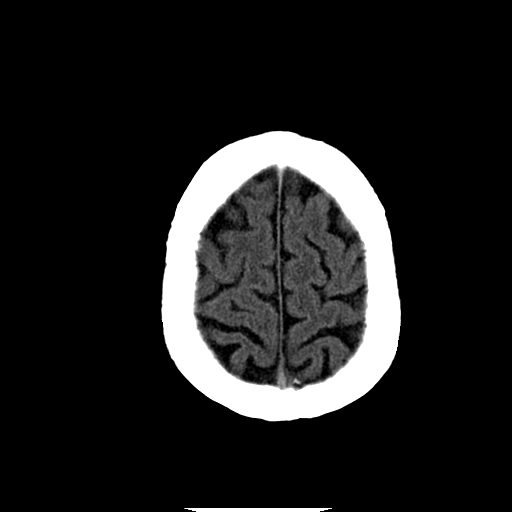

[Series 202: head w/o bone, idose (1) · axial · non-contrast · 0.49mm/px · z∈[+55,+190]mm · 8 of 64 slices shown]
[im 5/64  bone]
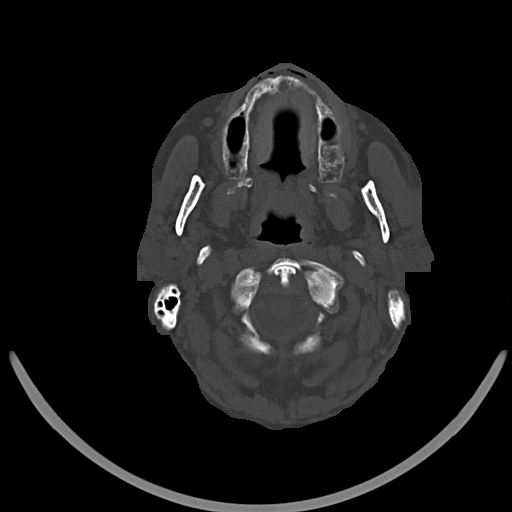
[im 13/64  bone]
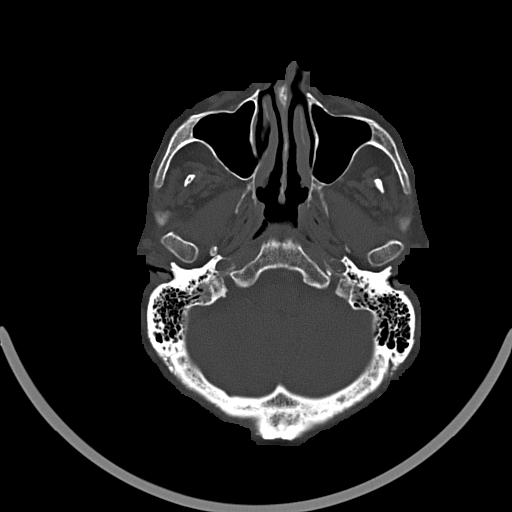
[im 22/64  bone]
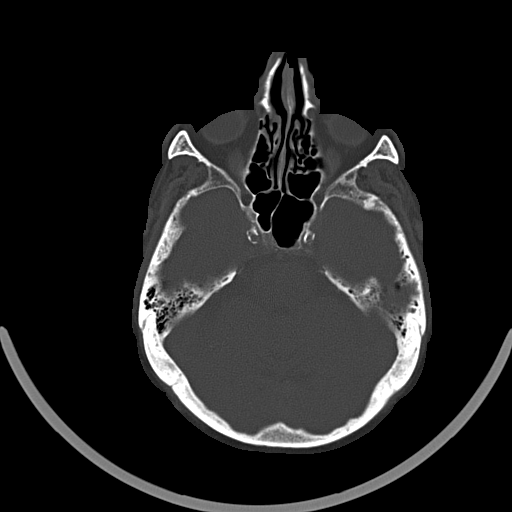
[im 30/64  bone]
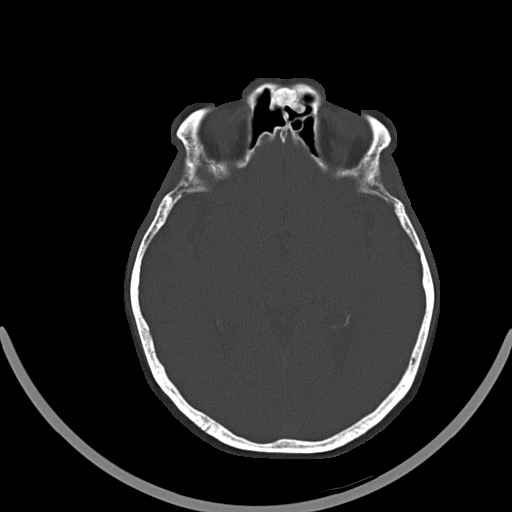
[im 34/64  bone]
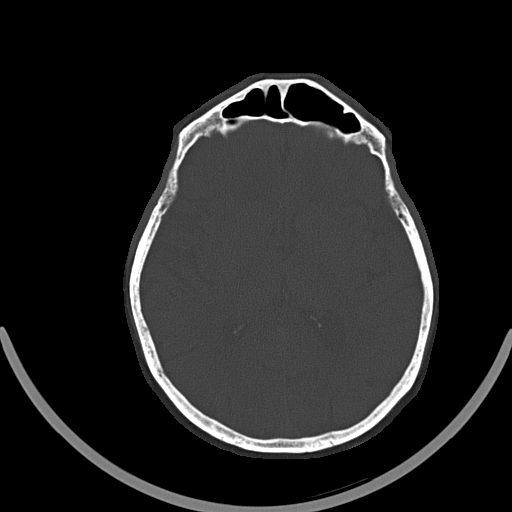
[im 43/64  bone]
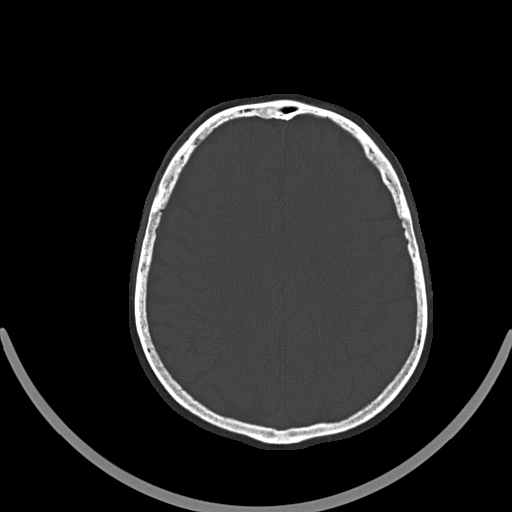
[im 51/64  bone]
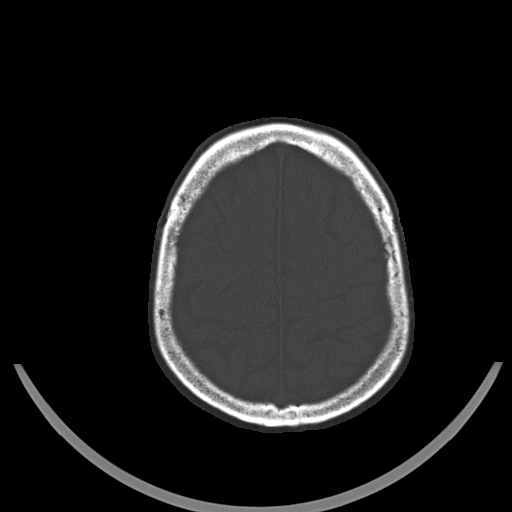
[im 59/64  bone]
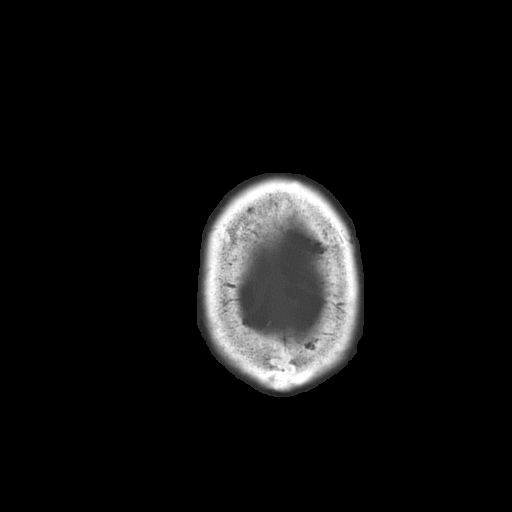

[Series 203: reformat head, idose (1) · axial · 0.55mm/px · z∈[+117,+191]mm · 4 of 26 slices shown]
[im 6/26  brain]
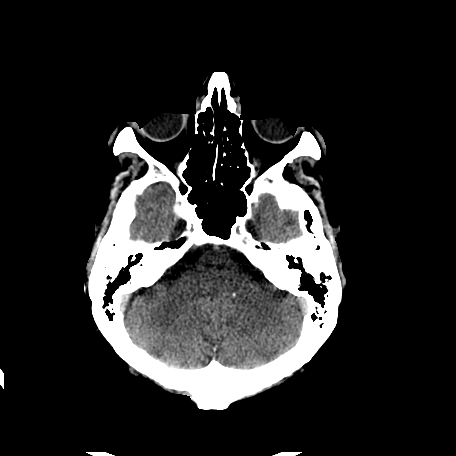
[im 11/26  brain]
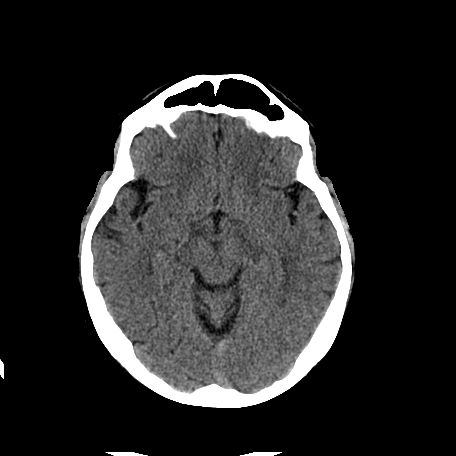
[im 16/26  brain]
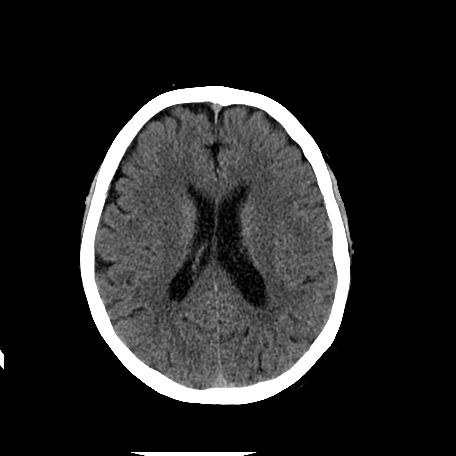
[im 21/26  brain]
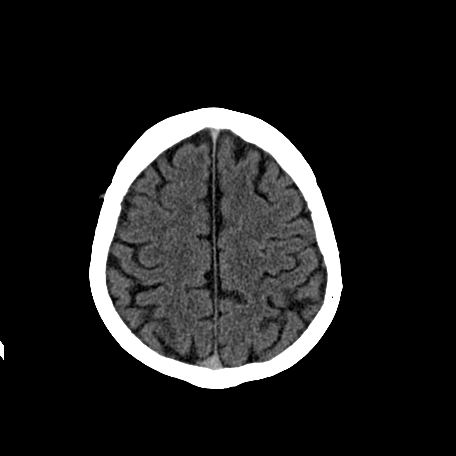

[18 of 30 positions shown; findings below may reference images not displayed]

FINDINGS: There is no evidence of mass effect, midline shift, or extra-axial
fluid collections. There is no evidence of a space-occupying lesion
or intracranial hemorrhage. There is no evidence of a cortical-based
area of acute infarction. There is generalized cerebral atrophy.

The ventricles and sulci are appropriate for the patient's age. The
basal cisterns are patent.

Visualized portions of the orbits are unremarkable. The visualized
portions of the paranasal sinuses and mastoid air cells are
unremarkable. Cerebrovascular atherosclerotic calcifications are
noted.

The osseous structures are unremarkable.
IMPRESSION: 1. No acute intracranial pathology.
2. Mild generalized cerebral atrophy.
